# Patient Record
Sex: Female | Born: 1955 | Race: White | Hispanic: No | Smoking: Never smoker
Health system: Southern US, Community
[De-identification: ages and names within clinical notes are randomized; demographics above are authoritative.]

## PROBLEM LIST (undated history)

## (undated) DIAGNOSIS — F32A Depression, unspecified: Secondary | ICD-10-CM

## (undated) DIAGNOSIS — T7840XA Allergy, unspecified, initial encounter: Secondary | ICD-10-CM

## (undated) DIAGNOSIS — F329 Major depressive disorder, single episode, unspecified: Secondary | ICD-10-CM

## (undated) DIAGNOSIS — E079 Disorder of thyroid, unspecified: Secondary | ICD-10-CM

## (undated) HISTORY — DX: Allergy, unspecified, initial encounter: T78.40XA

## (undated) HISTORY — DX: Major depressive disorder, single episode, unspecified: F32.9

## (undated) HISTORY — PX: KNEE SURGERY: SHX244

## (undated) HISTORY — DX: Disorder of thyroid, unspecified: E07.9

## (undated) HISTORY — DX: Depression, unspecified: F32.A

## (undated) HISTORY — PX: TUBAL LIGATION: SHX77

---

## 2005-03-07 ENCOUNTER — Ambulatory Visit: Payer: Self-pay | Admitting: Internal Medicine

## 2006-03-18 ENCOUNTER — Ambulatory Visit: Payer: Self-pay | Admitting: Internal Medicine

## 2007-08-27 ENCOUNTER — Ambulatory Visit: Payer: Self-pay | Admitting: Internal Medicine

## 2009-10-25 ENCOUNTER — Ambulatory Visit: Payer: Self-pay | Admitting: Internal Medicine

## 2009-10-30 ENCOUNTER — Emergency Department: Payer: Self-pay | Admitting: Emergency Medicine

## 2009-12-07 ENCOUNTER — Other Ambulatory Visit: Payer: Self-pay

## 2010-01-19 ENCOUNTER — Other Ambulatory Visit: Payer: Self-pay

## 2010-10-18 ENCOUNTER — Other Ambulatory Visit: Payer: Self-pay

## 2010-11-30 ENCOUNTER — Ambulatory Visit: Payer: Self-pay

## 2010-12-06 ENCOUNTER — Ambulatory Visit: Payer: Self-pay

## 2011-09-20 ENCOUNTER — Ambulatory Visit: Payer: Self-pay | Admitting: Rheumatology

## 2012-04-01 ENCOUNTER — Ambulatory Visit: Payer: Self-pay

## 2013-01-07 ENCOUNTER — Other Ambulatory Visit: Payer: Self-pay

## 2013-01-07 LAB — CBC WITH DIFFERENTIAL/PLATELET
Eosinophil #: 0.5 10*3/uL (ref 0.0–0.7)
Eosinophil %: 8.3 %
HCT: 37.2 % (ref 35.0–47.0)
Lymphocyte #: 1.6 10*3/uL (ref 1.0–3.6)
MCV: 95 fL (ref 80–100)
Monocyte #: 0.3 x10 3/mm (ref 0.2–0.9)
Monocyte %: 5.8 %
Neutrophil %: 57.4 %
Platelet: 350 10*3/uL (ref 150–440)
RBC: 3.92 10*6/uL (ref 3.80–5.20)
RDW: 13.1 % (ref 11.5–14.5)

## 2013-01-07 LAB — LIPID PANEL
HDL Cholesterol: 48 mg/dL (ref 40–60)
Triglycerides: 124 mg/dL (ref 0–200)
VLDL Cholesterol, Calc: 25 mg/dL (ref 5–40)

## 2013-01-07 LAB — COMPREHENSIVE METABOLIC PANEL
BUN: 11 mg/dL (ref 7–18)
Bilirubin,Total: 0.3 mg/dL (ref 0.2–1.0)
Calcium, Total: 9.1 mg/dL (ref 8.5–10.1)
Chloride: 106 mmol/L (ref 98–107)
EGFR (African American): >60
Glucose: 87 mg/dL (ref 65–99)
Osmolality: 276 (ref 275–301)
SGOT(AST): 25 U/L (ref 15–37)
SGPT (ALT): 29 U/L (ref 12–78)
Sodium: 139 mmol/L (ref 136–145)
Total Protein: 8.3 g/dL — ABNORMAL HIGH (ref 6.4–8.2)

## 2013-01-07 LAB — TSH: Thyroid Stimulating Horm: 1.25 u[IU]/mL

## 2013-03-01 ENCOUNTER — Emergency Department: Payer: Self-pay | Admitting: Emergency Medicine

## 2013-04-05 ENCOUNTER — Ambulatory Visit: Payer: Self-pay

## 2013-11-11 ENCOUNTER — Emergency Department: Payer: Self-pay | Admitting: Emergency Medicine

## 2013-11-12 ENCOUNTER — Emergency Department: Payer: Self-pay | Admitting: Emergency Medicine

## 2014-10-13 ENCOUNTER — Other Ambulatory Visit: Payer: Self-pay | Admitting: Physician Assistant

## 2014-10-13 DIAGNOSIS — Z1231 Encounter for screening mammogram for malignant neoplasm of breast: Secondary | ICD-10-CM

## 2014-10-19 ENCOUNTER — Ambulatory Visit
Admission: RE | Admit: 2014-10-19 | Discharge: 2014-10-19 | Disposition: A | Discharge status: Home / Self Care | Payer: 59 | Source: Ambulatory Visit | Attending: Physician Assistant | Admitting: Physician Assistant

## 2014-10-19 DIAGNOSIS — Z801 Family history of malignant neoplasm of trachea, bronchus and lung: Secondary | ICD-10-CM | POA: Diagnosis not present

## 2014-10-19 DIAGNOSIS — Z1231 Encounter for screening mammogram for malignant neoplasm of breast: Secondary | ICD-10-CM | POA: Diagnosis not present

## 2014-11-17 ENCOUNTER — Other Ambulatory Visit
Admission: RE | Admit: 2014-11-17 | Discharge: 2014-11-17 | Disposition: A | Discharge status: Home / Self Care | Payer: 59 | Source: Ambulatory Visit | Attending: Physician Assistant | Admitting: Physician Assistant

## 2014-11-17 DIAGNOSIS — E559 Vitamin D deficiency, unspecified: Secondary | ICD-10-CM | POA: Diagnosis not present

## 2014-11-17 LAB — CBC WITH DIFFERENTIAL/PLATELET
BASOS ABS: 0 10*3/uL (ref 0–0.1)
BASOS PCT: 1 %
Eosinophils Absolute: 0.2 10*3/uL (ref 0–0.7)
Eosinophils Relative: 4 %
HEMATOCRIT: 36 % (ref 35.0–47.0)
Hemoglobin: 12.2 g/dL (ref 12.0–16.0)
LYMPHS PCT: 25 %
Lymphs Abs: 1.5 10*3/uL (ref 1.0–3.6)
MCH: 32.4 pg (ref 26.0–34.0)
MCHC: 34 g/dL (ref 32.0–36.0)
MCV: 95.3 fL (ref 80.0–100.0)
MONO ABS: 0.3 10*3/uL (ref 0.2–0.9)
Monocytes Relative: 5 %
NEUTROS PCT: 65 %
Neutro Abs: 3.8 10*3/uL (ref 1.4–6.5)
Platelets: 303 10*3/uL (ref 150–440)
RBC: 3.78 MIL/uL — ABNORMAL LOW (ref 3.80–5.20)
RDW: 12.7 % (ref 11.5–14.5)
WBC: 5.9 10*3/uL (ref 3.6–11.0)

## 2014-11-17 LAB — COMPREHENSIVE METABOLIC PANEL
ALT: 15 U/L (ref 14–54)
ANION GAP: 7 (ref 5–15)
AST: 20 U/L (ref 15–41)
Albumin: 4 g/dL (ref 3.5–5.0)
Alkaline Phosphatase: 57 U/L (ref 38–126)
BUN: 9 mg/dL (ref 6–20)
CHLORIDE: 107 mmol/L (ref 101–111)
CO2: 26 mmol/L (ref 22–32)
CREATININE: 0.66 mg/dL (ref 0.44–1.00)
Calcium: 9.1 mg/dL (ref 8.9–10.3)
Glucose, Bld: 96 mg/dL (ref 65–99)
Potassium: 4 mmol/L (ref 3.5–5.1)
Sodium: 140 mmol/L (ref 135–145)
TOTAL PROTEIN: 7.6 g/dL (ref 6.5–8.1)
Total Bilirubin: 0.2 mg/dL — ABNORMAL LOW (ref 0.3–1.2)

## 2014-11-17 LAB — LIPID PANEL
CHOLESTEROL: 177 mg/dL (ref 0–200)
HDL: 47 mg/dL (ref >40)
LDL Cholesterol: 111 mg/dL — ABNORMAL HIGH (ref 0–99)
TRIGLYCERIDES: 97 mg/dL (ref <150)
Total CHOL/HDL Ratio: 3.8 RATIO
VLDL: 19 mg/dL (ref 0–40)

## 2014-11-17 LAB — T4, FREE: FREE T4: 0.98 ng/dL (ref 0.61–1.12)

## 2014-11-17 LAB — TSH: TSH: 0.456 u[IU]/mL (ref 0.350–4.500)

## 2014-11-18 LAB — T4: T4, Total: 10.1 ug/dL (ref 4.5–12.0)

## 2014-11-18 LAB — VITAMIN D 25 HYDROXY (VIT D DEFICIENCY, FRACTURES): Vit D, 25-Hydroxy: 25 ng/mL — ABNORMAL LOW (ref 30.0–100.0)

## 2014-11-18 LAB — T3: T3 TOTAL: 115 ng/dL (ref 71–180)

## 2015-04-03 ENCOUNTER — Other Ambulatory Visit: Payer: Self-pay | Admitting: Nurse Practitioner

## 2015-04-03 ENCOUNTER — Ambulatory Visit
Admission: RE | Admit: 2015-04-03 | Discharge: 2015-04-03 | Disposition: A | Discharge status: Home / Self Care | Payer: Managed Care, Other (non HMO) | Source: Ambulatory Visit | Attending: Nurse Practitioner | Admitting: Nurse Practitioner

## 2015-04-03 DIAGNOSIS — M545 Low back pain: Secondary | ICD-10-CM

## 2015-04-03 DIAGNOSIS — M25551 Pain in right hip: Secondary | ICD-10-CM

## 2015-07-04 ENCOUNTER — Other Ambulatory Visit
Admission: RE | Admit: 2015-07-04 | Discharge: 2015-07-04 | Disposition: A | Discharge status: Home / Self Care | Payer: Managed Care, Other (non HMO) | Source: Ambulatory Visit | Attending: Nurse Practitioner | Admitting: Nurse Practitioner

## 2015-07-04 DIAGNOSIS — E039 Hypothyroidism, unspecified: Secondary | ICD-10-CM | POA: Insufficient documentation

## 2015-07-04 LAB — T4, FREE: Free T4: 0.83 ng/dL (ref 0.61–1.12)

## 2015-07-04 LAB — TSH: TSH: 1.848 u[IU]/mL (ref 0.350–4.500)

## 2015-07-09 ENCOUNTER — Emergency Department
Admission: EM | Admit: 2015-07-09 | Discharge: 2015-07-09 | Disposition: A | Discharge status: Home / Self Care | Payer: Managed Care, Other (non HMO) | Attending: Emergency Medicine | Admitting: Emergency Medicine

## 2015-07-09 DIAGNOSIS — J101 Influenza due to other identified influenza virus with other respiratory manifestations: Secondary | ICD-10-CM | POA: Insufficient documentation

## 2015-07-09 DIAGNOSIS — J302 Other seasonal allergic rhinitis: Secondary | ICD-10-CM | POA: Diagnosis not present

## 2015-07-09 DIAGNOSIS — R509 Fever, unspecified: Secondary | ICD-10-CM | POA: Diagnosis present

## 2015-07-09 DIAGNOSIS — R0982 Postnasal drip: Secondary | ICD-10-CM

## 2015-07-09 LAB — RAPID INFLUENZA A&B ANTIGENS (ARMC ONLY): INFLUENZA B (ARMC): NEGATIVE

## 2015-07-09 LAB — RAPID INFLUENZA A&B ANTIGENS: Influenza A (ARMC): POSITIVE — AB

## 2015-07-09 MED ORDER — FLUTICASONE PROPIONATE 50 MCG/ACT NA SUSP: 2.0000 | Freq: Every day | NASAL | Status: DC

## 2015-07-09 MED ORDER — OSELTAMIVIR PHOSPHATE 75 MG PO CAPS: 75.0000 mg | ORAL_CAPSULE | Freq: Two times a day (BID) | ORAL | Status: AC

## 2015-07-09 MED ORDER — BENZONATATE 100 MG PO CAPS: 100.0000 mg | ORAL_CAPSULE | Freq: Three times a day (TID) | ORAL | Status: DC | PRN

## 2015-07-09 NOTE — Discharge Instructions (Signed)
Influenza, Adult Influenza ("the flu") is a viral infection of the respiratory tract. It occurs more often in winter months because people spend more time in close contact with one another. Influenza can make you feel very sick. Influenza easily spreads from person to person (contagious). CAUSES  Influenza is caused by a virus that infects the respiratory tract. You can catch the virus by breathing in droplets from an infected person's cough or sneeze. You can also catch the virus by touching something that was recently contaminated with the virus and then touching your mouth, nose, or eyes. RISKS AND COMPLICATIONS You may be at risk for a more severe case of influenza if you smoke cigarettes, have diabetes, have chronic heart disease (such as heart failure) or lung disease (such as asthma), or if you have a weakened immune system. Elderly people and pregnant women are also at risk for more serious infections. The most common problem of influenza is a lung infection (pneumonia). Sometimes, this problem can require emergency medical care and may be life threatening. SIGNS AND SYMPTOMS  Symptoms typically last 4 to 10 days and may include:  Fever.  Chills.  Headache, body aches, and muscle aches.  Sore throat.  Chest discomfort and cough.  Poor appetite.  Weakness or feeling tired.  Dizziness.  Nausea or vomiting. DIAGNOSIS  Diagnosis of influenza is often made based on your history and a physical exam. A nose or throat swab test can be done to confirm the diagnosis. TREATMENT  In mild cases, influenza goes away on its own. Treatment is directed at relieving symptoms. For more severe cases, your health care provider may prescribe antiviral medicines to shorten the sickness. Antibiotic medicines are not effective because the infection is caused by a virus, not by bacteria. HOME CARE INSTRUCTIONS  Take medicines only as directed by your health care provider.  Use a cool mist humidifier  to make breathing easier.  Get plenty of rest until your temperature returns to normal. This usually takes 3 to 4 days.  Drink enough fluid to keep your urine clear or pale yellow.  Cover yourmouth and nosewhen coughing or sneezing,and wash your handswellto prevent thevirusfrom spreading.  Stay homefromwork orschool untilthe fever is gonefor at least 291full day. PREVENTION  An annual influenza vaccination (flu shot) is the best way to avoid getting influenza. An annual flu shot is now routinely recommended for all adults in the [place] SEEK MEDICAL CARE IF:  You experiencechest pain, yourcough worsens,or you producemore mucus.  Youhave nausea,vomiting, ordiarrhea.  Your fever returns or gets worse. SEEK IMMEDIATE MEDICAL CARE IF:  You havetrouble breathing, you become short of breath,or your skin ornails becomebluish.  You have severe painor stiffnessin the neck.  You develop a sudden headache, or pain in the face or ear.  You have nausea or vomiting that you cannot control. MAKE SURE YOU:   Understand these instructions.  Will watch your condition.  Will get help right away if you are not doing well or get worse.   This information is not intended to replace advice given to you by your health care provider. Make sure you discuss any questions you have with your health care provider.   Document Released: 04/12/2000 Document Revised: 05/06/2014 Document Reviewed: 07/15/2011 Elsevier Interactive Patient Education [employer]2016 [employer].  Take the prescription meds as directed. Rest and hydrate. Follow-up with your provider or return for worsening symptoms.

## 2015-07-09 NOTE — ED Provider Notes (Signed)
[facility] Emergency Department Provider Note ____________________________________________  Time seen: 69  I have reviewed the triage vital signs and the nursing notes.  HISTORY  Chief Complaint  Cough and Fever  HPI Erin Chan is a 60 y.o. female presents to the ED for evaluation of 2 days complaint of cough and sore throat and general malaise. She describes significant sinus drainage and runny nose. She describes yesterday she had episode of cough-induced vomiting where she had significant phlegm in her vomitus. She denies any fevers, chills, or sweats.  No past medical history on file.  There are no active problems to display for this patient.   No past surgical history on file.  Current Outpatient Rx  Name  Route  Sig  Dispense  Refill  . benzonatate (TESSALON PERLES) 100 MG capsule   Oral   Take 1 capsule (100 mg total) by mouth 3 (three) times daily as needed for cough (Take 1-2 per dose).   30 capsule   0   . fluticasone (FLONASE) 50 MCG/ACT nasal spray   Each Nare   Place 2 sprays into both nostrils daily.   16 g   0    Allergies Review of patient's allergies indicates no known allergies.  Family History  Problem Relation Age of Onset  . Lung cancer Mother     Social History Social History  Substance Use Topics  . Smoking status: Not on file  . Smokeless tobacco: Not on file  . Alcohol Use: Not on file   Review of Systems  Constitutional: Negative for fever. Eyes: Negative for visual changes. ENT: Negative for sore throat. Cardiovascular: Negative for chest pain. Respiratory: Negative for shortness of breath. Gastrointestinal: Negative for abdominal pain, vomiting and diarrhea. Genitourinary: Negative for dysuria. Musculoskeletal: Negative for back pain. Skin: Negative for rash. Neurological: Negative for headaches, focal weakness or numbness. ____________________________________________  PHYSICAL  EXAM:  VITAL SIGNS: ED Triage Vitals  Enc Vitals Group     BP 07/09/15 1243 126/70 mmHg     Pulse Rate 07/09/15 1243 86     Resp 07/09/15 1243 18     Temp 07/09/15 1243 98.4 F (36.9 C)     Temp src --      SpO2 07/09/15 1243 95 %     Weight 07/09/15 1243 133 lb (60.328 kg)     Height --      Head Cir --      Peak Flow --      Pain Score 07/09/15 1243 5     Pain Loc --      Pain Edu? --      Excl. in GC? --    Constitutional: Alert and oriented. Well appearing and in no distress. Head: Normocephalic and atraumatic.      Eyes: Conjunctivae are normal. PERRL. Normal extraocular movements      Ears: Canals clear. TMs intact bilaterally.   Nose: No congestion. Clear rhinorrhea.   Mouth/Throat: Mucous membranes are moist.   Neck: Supple. No thyromegaly. Hematological/Lymphatic/Immunological: No cervical lymphadenopathy. Cardiovascular: Normal rate, regular rhythm.  Respiratory: Normal respiratory effort. No wheezes/rales/rhonchi. Gastrointestinal: Soft and nontender. No distention, rebound, guarding, or CVA tenderness. Musculoskeletal: Nontender with normal range of motion in all extremities.  Neurologic:  Normal gait without ataxia. Normal speech and language. No gross focal neurologic deficits are appreciated. Skin:  Skin is warm, dry and intact. No rash noted. Psychiatric: Mood and affect are normal. Patient exhibits appropriate insight and judgment. ____________________________________________  LABS (pertinent positives/negatives) Labs Reviewed  RAPID INFLUENZA A&B ANTIGENS ([facility] ONLY) - Abnormal; Notable for the following:    Influenza A ([facility]) POSITIVE (*)    All other components within normal limits  ____________________________________________  INITIAL IMPRESSION / ASSESSMENT AND PLAN / ED COURSE  Patient with symptoms consistent with influenza upon testing. She will  be discharged with instructions to dose daily allergy medicines for symptom management.  She will also be provided with a prescription for Tamiflu, Flonase, and Tessalon Perles for symptom relief. She'll follow with the primary care provider for further symptom management. Work note is provided for 3 days as requested. ____________________________________________  FINAL CLINICAL IMPRESSION(S) / ED DIAGNOSES  Final diagnoses:  Other seasonal allergic rhinitis  Post-nasal drainage  Influenza A      Lissa HoardJenise V Bacon Laurice Iglesia, PA-C 07/09/15 1441  Jene Everyobert Kinner, MD 07/09/15 [phone]

## 2015-07-09 NOTE — ED Notes (Signed)
Pt reports cough, sore throat, and fevers. Pt states " I just don't feel good". Pt reports symptoms started Friday.

## 2015-07-09 NOTE — ED Notes (Signed)
Pt c/o flu like s/s, denies fever.  NAD

## 2016-03-08 ENCOUNTER — Other Ambulatory Visit: Payer: Self-pay | Admitting: Physician Assistant

## 2016-03-08 DIAGNOSIS — Z1231 Encounter for screening mammogram for malignant neoplasm of breast: Secondary | ICD-10-CM

## 2016-03-12 ENCOUNTER — Other Ambulatory Visit
Admission: RE | Admit: 2016-03-12 | Discharge: 2016-03-12 | Disposition: A | Discharge status: Home / Self Care | Payer: Managed Care, Other (non HMO) | Source: Ambulatory Visit | Attending: Physician Assistant | Admitting: Physician Assistant

## 2016-03-12 DIAGNOSIS — E039 Hypothyroidism, unspecified: Secondary | ICD-10-CM | POA: Diagnosis not present

## 2016-03-12 DIAGNOSIS — E782 Mixed hyperlipidemia: Secondary | ICD-10-CM | POA: Insufficient documentation

## 2016-03-12 DIAGNOSIS — Z0001 Encounter for general adult medical examination with abnormal findings: Secondary | ICD-10-CM | POA: Insufficient documentation

## 2016-03-12 LAB — CBC WITH DIFFERENTIAL/PLATELET
Basophils Absolute: 0 10*3/uL (ref 0–0.1)
Basophils Relative: 1 %
EOS PCT: 10 %
Eosinophils Absolute: 0.5 10*3/uL (ref 0–0.7)
HCT: 37.9 % (ref 35.0–47.0)
Hemoglobin: 13 g/dL (ref 12.0–16.0)
LYMPHS ABS: 1.4 10*3/uL (ref 1.0–3.6)
Lymphocytes Relative: 30 %
MCH: 32.6 pg (ref 26.0–34.0)
MCHC: 34.3 g/dL (ref 32.0–36.0)
MCV: 95.2 fL (ref 80.0–100.0)
MONO ABS: 0.4 10*3/uL (ref 0.2–0.9)
Monocytes Relative: 8 %
Neutro Abs: 2.4 10*3/uL (ref 1.4–6.5)
Neutrophils Relative %: 51 %
Platelets: 287 10*3/uL (ref 150–440)
RBC: 3.98 MIL/uL (ref 3.80–5.20)
RDW: 12.8 % (ref 11.5–14.5)
WBC: 4.7 10*3/uL (ref 3.6–11.0)

## 2016-03-12 LAB — COMPREHENSIVE METABOLIC PANEL
ALBUMIN: 3.9 g/dL (ref 3.5–5.0)
ALT: 21 U/L (ref 14–54)
ANION GAP: 6 (ref 5–15)
AST: 25 U/L (ref 15–41)
Alkaline Phosphatase: 55 U/L (ref 38–126)
BILIRUBIN TOTAL: 0.4 mg/dL (ref 0.3–1.2)
BUN: 13 mg/dL (ref 6–20)
CO2: 27 mmol/L (ref 22–32)
Calcium: 9 mg/dL (ref 8.9–10.3)
Chloride: 106 mmol/L (ref 101–111)
Creatinine, Ser: 0.65 mg/dL (ref 0.44–1.00)
GFR calc Af Amer: >60 mL/min (ref >60)
Glucose, Bld: 93 mg/dL (ref 65–99)
Potassium: 4.2 mmol/L (ref 3.5–5.1)
Sodium: 139 mmol/L (ref 135–145)
TOTAL PROTEIN: 7.8 g/dL (ref 6.5–8.1)

## 2016-03-12 LAB — LIPID PANEL
CHOL/HDL RATIO: 4.4 ratio
Cholesterol: 188 mg/dL (ref 0–200)
HDL: 43 mg/dL (ref >40)
LDL Cholesterol: 126 mg/dL — ABNORMAL HIGH (ref 0–99)
Triglycerides: 94 mg/dL (ref <150)
VLDL: 19 mg/dL (ref 0–40)

## 2016-03-12 LAB — T4, FREE: Free T4: 0.83 ng/dL (ref 0.61–1.12)

## 2016-03-12 LAB — TSH: TSH: 5.945 u[IU]/mL — ABNORMAL HIGH (ref 0.350–4.500)

## 2016-03-13 LAB — T4: T4, Total: 7.6 ug/dL (ref 4.5–12.0)

## 2016-03-13 LAB — HEMOGLOBIN A1C
Hgb A1c MFr Bld: 5.4 % (ref 4.8–5.6)
MEAN PLASMA GLUCOSE: 108 mg/dL

## 2016-04-16 ENCOUNTER — Ambulatory Visit
Admission: RE | Admit: 2016-04-16 | Discharge: 2016-04-16 | Disposition: A | Discharge status: Home / Self Care | Payer: Managed Care, Other (non HMO) | Source: Ambulatory Visit | Attending: Physician Assistant | Admitting: Physician Assistant

## 2016-04-16 DIAGNOSIS — Z1231 Encounter for screening mammogram for malignant neoplasm of breast: Secondary | ICD-10-CM | POA: Insufficient documentation

## 2017-03-12 ENCOUNTER — Other Ambulatory Visit
Admission: RE | Admit: 2017-03-12 | Discharge: 2017-03-12 | Disposition: A | Discharge status: Home / Self Care | Payer: Managed Care, Other (non HMO) | Source: Ambulatory Visit | Attending: Family Medicine | Admitting: Family Medicine

## 2017-03-12 DIAGNOSIS — E039 Hypothyroidism, unspecified: Secondary | ICD-10-CM | POA: Insufficient documentation

## 2017-03-12 DIAGNOSIS — Z0001 Encounter for general adult medical examination with abnormal findings: Secondary | ICD-10-CM | POA: Insufficient documentation

## 2017-03-12 DIAGNOSIS — E782 Mixed hyperlipidemia: Secondary | ICD-10-CM | POA: Diagnosis present

## 2017-03-12 LAB — T4, FREE: Free T4: 0.93 ng/dL (ref 0.61–1.12)

## 2017-03-12 LAB — LIPID PANEL
CHOL/HDL RATIO: 4 ratio
CHOLESTEROL: 188 mg/dL (ref 0–200)
HDL: 47 mg/dL (ref >40)
LDL CALC: 114 mg/dL — AB (ref 0–99)
TRIGLYCERIDES: 136 mg/dL (ref <150)
VLDL: 27 mg/dL (ref 0–40)

## 2017-03-12 LAB — COMPREHENSIVE METABOLIC PANEL
ALT: 17 U/L (ref 14–54)
AST: 21 U/L (ref 15–41)
Albumin: 4 g/dL (ref 3.5–5.0)
Alkaline Phosphatase: 58 U/L (ref 38–126)
Anion gap: 7 (ref 5–15)
BUN: 14 mg/dL (ref 6–20)
CHLORIDE: 106 mmol/L (ref 101–111)
CO2: 25 mmol/L (ref 22–32)
CREATININE: 0.64 mg/dL (ref 0.44–1.00)
Calcium: 9.2 mg/dL (ref 8.9–10.3)
Glucose, Bld: 96 mg/dL (ref 65–99)
POTASSIUM: 4.2 mmol/L (ref 3.5–5.1)
SODIUM: 138 mmol/L (ref 135–145)
Total Bilirubin: 0.4 mg/dL (ref 0.3–1.2)
Total Protein: 8.2 g/dL — ABNORMAL HIGH (ref 6.5–8.1)

## 2017-03-12 LAB — CBC WITH DIFFERENTIAL/PLATELET
BASOS ABS: 0 10*3/uL (ref 0–0.1)
BASOS PCT: 1 %
EOS ABS: 0.2 10*3/uL (ref 0–0.7)
EOS PCT: 3 %
HEMATOCRIT: 38.7 % (ref 35.0–47.0)
Hemoglobin: 12.9 g/dL (ref 12.0–16.0)
Lymphocytes Relative: 28 %
Lymphs Abs: 1.6 10*3/uL (ref 1.0–3.6)
MCH: 31.8 pg (ref 26.0–34.0)
MCHC: 33.2 g/dL (ref 32.0–36.0)
MCV: 95.9 fL (ref 80.0–100.0)
MONOS PCT: 6 %
Monocytes Absolute: 0.4 10*3/uL (ref 0.2–0.9)
Neutro Abs: 3.6 10*3/uL (ref 1.4–6.5)
Neutrophils Relative %: 62 %
PLATELETS: 328 10*3/uL (ref 150–440)
RBC: 4.04 MIL/uL (ref 3.80–5.20)
RDW: 12.9 % (ref 11.5–14.5)
WBC: 5.8 10*3/uL (ref 3.6–11.0)

## 2017-03-12 LAB — TSH: TSH: 3.072 u[IU]/mL (ref 0.350–4.500)

## 2017-03-13 LAB — VITAMIN D 25 HYDROXY (VIT D DEFICIENCY, FRACTURES): VIT D 25 HYDROXY: 21 ng/mL — AB (ref 30.0–100.0)

## 2017-06-30 ENCOUNTER — Other Ambulatory Visit: Payer: Self-pay | Admitting: Nurse Practitioner

## 2017-06-30 DIAGNOSIS — Z1239 Encounter for other screening for malignant neoplasm of breast: Secondary | ICD-10-CM

## 2017-07-25 ENCOUNTER — Other Ambulatory Visit: Payer: Self-pay | Admitting: Nurse Practitioner

## 2017-07-25 MED ORDER — ALBUTEROL SULFATE HFA 108 (90 BASE) MCG/ACT IN AERS: 2.0000 | INHALATION_SPRAY | Freq: Three times a day (TID) | RESPIRATORY_TRACT | 5 refills | Status: DC

## 2017-07-31 ENCOUNTER — Other Ambulatory Visit: Payer: Self-pay | Admitting: Internal Medicine

## 2017-08-18 ENCOUNTER — Other Ambulatory Visit: Payer: Self-pay

## 2017-08-18 ENCOUNTER — Other Ambulatory Visit: Payer: Self-pay | Admitting: Nurse Practitioner

## 2017-08-18 DIAGNOSIS — M810 Age-related osteoporosis without current pathological fracture: Secondary | ICD-10-CM

## 2017-08-18 MED ORDER — ALENDRONATE SODIUM 70 MG PO TABS: 70.0000 mg | ORAL_TABLET | ORAL | 4 refills | Status: DC

## 2017-08-18 NOTE — Progress Notes (Signed)
Med on hold per hospital pharmacy, needing override. Refilled with 11 refills and sent to [facility].

## 2017-08-22 ENCOUNTER — Ambulatory Visit
Admission: RE | Admit: 2017-08-22 | Discharge: 2017-08-22 | Disposition: A | Discharge status: Home / Self Care | Payer: 59 | Source: Ambulatory Visit | Attending: Nurse Practitioner | Admitting: Nurse Practitioner

## 2017-08-22 DIAGNOSIS — Z1239 Encounter for other screening for malignant neoplasm of breast: Secondary | ICD-10-CM

## 2017-08-22 DIAGNOSIS — Z1231 Encounter for screening mammogram for malignant neoplasm of breast: Secondary | ICD-10-CM | POA: Diagnosis present

## 2017-09-08 ENCOUNTER — Ambulatory Visit: Payer: 59 | Admitting: Nurse Practitioner

## 2017-09-08 ENCOUNTER — Encounter: Payer: Self-pay | Admitting: Nurse Practitioner

## 2017-09-08 VITALS — BP 123/75 | HR 68 | Resp 16 | Ht 59.0 in | Wt 144.8 lb

## 2017-09-08 DIAGNOSIS — E039 Hypothyroidism, unspecified: Secondary | ICD-10-CM | POA: Diagnosis not present

## 2017-09-08 DIAGNOSIS — E2839 Other primary ovarian failure: Secondary | ICD-10-CM | POA: Diagnosis not present

## 2017-09-08 DIAGNOSIS — J4520 Mild intermittent asthma, uncomplicated: Secondary | ICD-10-CM | POA: Insufficient documentation

## 2017-09-08 DIAGNOSIS — M810 Age-related osteoporosis without current pathological fracture: Secondary | ICD-10-CM | POA: Insufficient documentation

## 2017-09-08 NOTE — Progress Notes (Signed)
[facility] [address] [place], [place] [address]  Internal MEDICINE  Office Visit Note  Patient Name: Erin Chan  [id number]  [id number]  Date of Service: 09/08/2017  Chief Complaint  Patient presents with  . Osteoporosis    Patient is due to have bone density. Last bone density was done 2016, showing osteoporosis in spine and osteopenia in femur and pelvis.  Continues to have episodes of depression. Son passed away four years ago. Does have more good days than bad. Does lean on her family members and friends when she needs to. She does have a lving daughter who she leans on as well.    Pt is here for routine follow up.    Current Medication: Outpatient Encounter Medications as of 09/08/2017  Medication Sig  . albuterol (PROVENTIL HFA;VENTOLIN HFA) 108 (90 Base) MCG/ACT inhaler Inhale 2 puffs into the lungs 3 (three) times daily.  Marland Kitchen alendronate (FOSAMAX) 70 MG tablet Take 1 tablet (70 mg total) by mouth once a week. Take with a full glass of water on an empty stomach.  . clobetasol cream (TEMOVATE) 0.05 % Apply 1 application topically 2 (two) times daily.  . fluticasone (FLONASE) 50 MCG/ACT nasal spray Place 2 sprays into both nostrils daily.  Marland Kitchen levothyroxine (SYNTHROID, LEVOTHROID) 75 MCG tablet TAKE 1 TABLET BY MOUTH ONCE A DAY BEFORE BREAKFAST  . [DISCONTINUED] benzonatate (TESSALON PERLES) 100 MG capsule Take 1 capsule (100 mg total) by mouth 3 (three) times daily as needed for cough (Take 1-2 per dose). (Patient not taking: Reported on 09/08/2017)   No facility-administered encounter medications on file as of 09/08/2017.     Surgical History: Past Surgical History:  Procedure Laterality Date  . KNEE SURGERY Right   . TUBAL LIGATION      Medical History: Past Medical History:  Diagnosis Date  . Allergy   . Depression   . Thyroid disease     Family History: Family History  Problem Relation Age of Onset  . Lung cancer Mother   . Diabetes  Maternal Grandmother   . Heart attack Maternal Grandfather   . Breast cancer Neg Hx     Social History   Socioeconomic History  . Marital status: Divorced    Spouse name: Not on file  . Number of children: Not on file  . Years of education: Not on file  . Highest education level: Not on file  Occupational History  . Not on file  Social Needs  . Financial resource strain: Not on file  . Food insecurity:    Worry: Not on file    Inability: Not on file  . Transportation needs:    Medical: Not on file    Non-medical: Not on file  Tobacco Use  . Smoking status: Never Smoker  . Smokeless tobacco: Never Used  Substance and Sexual Activity  . Alcohol use: Never    Frequency: Never  . Drug use: Never  . Sexual activity: Not on file  Lifestyle  . Physical activity:    Days per week: Not on file    Minutes per session: Not on file  . Stress: Not on file  Relationships  . Social connections:    Talks on phone: Not on file    Gets together: Not on file    Attends religious service: Not on file    Active member of club or organization: Not on file    Attends meetings of clubs or organizations: Not on file  Relationship status: Not on file  . Intimate partner violence:    Fear of current or ex partner: Not on file    Emotionally abused: Not on file    Physically abused: Not on file    Forced sexual activity: Not on file  Other Topics Concern  . Not on file  Social History Narrative  . Not on file      Review of Systems  Constitutional: Negative for chills, fatigue and unexpected weight change.  HENT: Positive for sneezing. Negative for congestion, postnasal drip, rhinorrhea and sore throat.   Eyes: Negative.  Negative for redness.  Respiratory: Negative for cough, chest tightness and shortness of breath.   Cardiovascular: Negative for chest pain and palpitations.  Gastrointestinal: Negative for abdominal pain, constipation, diarrhea, nausea and vomiting.  Endocrine:  Negative for cold intolerance, heat intolerance, polydipsia, polyphagia and polyuria.  Genitourinary: Negative.  Negative for dysuria and frequency.  Musculoskeletal: Negative for arthralgias, back pain, joint swelling and neck pain.  Skin: Negative for rash.  Allergic/Immunologic: Positive for environmental allergies.  Neurological: Positive for dizziness and headaches. Negative for tremors and numbness.       Intermittent symptoms, mostly when sinuses are acting up.   Hematological: Negative for adenopathy. Does not bruise/bleed easily.  Psychiatric/Behavioral: Positive for dysphoric mood. Negative for behavioral problems (Depression), sleep disturbance and suicidal ideas. The patient is not nervous/anxious.    Today's Vitals   09/08/17 0925  BP: 123/75  Pulse: 68  Resp: 16  SpO2: 98%  Weight: 144 lb 12.8 oz (65.7 kg)  Height:  (1.499 m)    Physical Exam  Constitutional: She is oriented to person, place, and time. She appears well-developed and well-nourished. No distress.  HENT:  Head: Normocephalic and atraumatic.  Mouth/Throat: Oropharynx is clear and moist. No oropharyngeal exudate.  Eyes: Pupils are equal, round, and reactive to light. Conjunctivae and EOM are normal.  Neck: Normal range of motion. Neck supple. No JVD present. Carotid bruit is not present. No tracheal deviation present. No thyromegaly present.  Cardiovascular: Normal rate, regular rhythm and normal heart sounds. Exam reveals no gallop and no friction rub.  No murmur heard. Pulmonary/Chest: Effort normal and breath sounds normal. No respiratory distress. She has no wheezes. She has no rales. She exhibits no tenderness.  Abdominal: Soft. Bowel sounds are normal. There is no tenderness.  Musculoskeletal: Normal range of motion.  Lymphadenopathy:    She has no cervical adenopathy.  Neurological: She is alert and oriented to person, place, and time. She displays normal reflexes. No cranial nerve deficit.   Skin: Skin is warm and dry. Capillary refill takes less than 2 seconds. She is not diaphoretic.  Psychiatric: She has a normal mood and affect. Her behavior is normal. Judgment and thought content normal.  Nursing note and vitals reviewed.   Assessment/Plan: 1. Age-related osteoporosis without current pathological fracture New bone density ordered. Will advise patient of results and adjust treatment as indicated.  - DG Bone Density; Future  2. Ovarian failure Continue fosamax as prescribed. Adjust dosing/treatment based on new test results.  - DG Bone Density; Future  3. Acquired hypothyroidism Stable. Continue levothyroxine as prescribed.   4. Mild intermittent asthma without complication Continue rescue inhaler as needed and as prescribed.   General Counseling: Lucielle verbalizes understanding of the findings of todays visit and agrees with plan of treatment. I have discussed any further diagnostic evaluation that may be needed or ordered today. We also reviewed her medications today. she  has been encouraged to call the office with any questions or concerns that should arise related to todays visit.  This patient was seen by Vincent Gros, FNP- C in Collaboration with Dr Lyndon Code as a part of collaborative care agreement    Orders Placed This Encounter  Procedures  . DG Bone Density    No orders of the defined types were placed in this encounter.   Time spent: 42 Minutes      Dr Lyndon Code Internal medicine

## 2017-10-02 ENCOUNTER — Ambulatory Visit: Payer: Self-pay | Admitting: Nurse Practitioner

## 2017-10-09 ENCOUNTER — Other Ambulatory Visit: Payer: Self-pay

## 2017-10-09 ENCOUNTER — Emergency Department
Admission: EM | Admit: 2017-10-09 | Discharge: 2017-10-10 | Disposition: A | Discharge status: Home / Self Care | Payer: No Typology Code available for payment source | Attending: Emergency Medicine | Admitting: Emergency Medicine

## 2017-10-09 ENCOUNTER — Encounter: Payer: Self-pay | Admitting: Emergency Medicine

## 2017-10-09 ENCOUNTER — Emergency Department: Payer: No Typology Code available for payment source

## 2017-10-09 DIAGNOSIS — E039 Hypothyroidism, unspecified: Secondary | ICD-10-CM | POA: Insufficient documentation

## 2017-10-09 DIAGNOSIS — R05 Cough: Secondary | ICD-10-CM | POA: Diagnosis not present

## 2017-10-09 DIAGNOSIS — Z79899 Other long term (current) drug therapy: Secondary | ICD-10-CM | POA: Insufficient documentation

## 2017-10-09 DIAGNOSIS — R110 Nausea: Secondary | ICD-10-CM

## 2017-10-09 DIAGNOSIS — R519 Headache, unspecified: Secondary | ICD-10-CM

## 2017-10-09 DIAGNOSIS — J4520 Mild intermittent asthma, uncomplicated: Secondary | ICD-10-CM | POA: Insufficient documentation

## 2017-10-09 DIAGNOSIS — R51 Headache: Secondary | ICD-10-CM | POA: Insufficient documentation

## 2017-10-09 DIAGNOSIS — R509 Fever, unspecified: Secondary | ICD-10-CM | POA: Insufficient documentation

## 2017-10-09 DIAGNOSIS — R059 Cough, unspecified: Secondary | ICD-10-CM

## 2017-10-09 LAB — CBC WITH DIFFERENTIAL/PLATELET
BASOS ABS: 0 10*3/uL (ref 0–0.1)
Basophils Relative: 1 %
Eosinophils Absolute: 0 10*3/uL (ref 0–0.7)
Eosinophils Relative: 0 %
HEMATOCRIT: 37.5 % (ref 35.0–47.0)
HEMOGLOBIN: 13.1 g/dL (ref 12.0–16.0)
LYMPHS PCT: 12 %
Lymphs Abs: 0.4 10*3/uL — ABNORMAL LOW (ref 1.0–3.6)
MCH: 32.9 pg (ref 26.0–34.0)
MCHC: 35 g/dL (ref 32.0–36.0)
MCV: 93.8 fL (ref 80.0–100.0)
Monocytes Absolute: 0.1 10*3/uL — ABNORMAL LOW (ref 0.2–0.9)
Monocytes Relative: 4 %
NEUTROS ABS: 2.4 10*3/uL (ref 1.4–6.5)
NEUTROS PCT: 83 %
Platelets: 137 10*3/uL — ABNORMAL LOW (ref 150–440)
RBC: 3.99 MIL/uL (ref 3.80–5.20)
RDW: 12.7 % (ref 11.5–14.5)
WBC: 2.9 10*3/uL — AB (ref 3.6–11.0)

## 2017-10-09 LAB — URINALYSIS, COMPLETE (UACMP) WITH MICROSCOPIC
BACTERIA UA: NONE SEEN
BILIRUBIN URINE: NEGATIVE
Glucose, UA: NEGATIVE mg/dL
KETONES UR: 20 mg/dL — AB
LEUKOCYTES UA: NEGATIVE
Nitrite: NEGATIVE
Protein, ur: 100 mg/dL — AB
Specific Gravity, Urine: 1.026 (ref 1.005–1.030)
pH: 5 (ref 5.0–8.0)

## 2017-10-09 LAB — COMPREHENSIVE METABOLIC PANEL
ALT: 52 U/L (ref 14–54)
AST: 107 U/L — AB (ref 15–41)
Albumin: 4 g/dL (ref 3.5–5.0)
Alkaline Phosphatase: 87 U/L (ref 38–126)
Anion gap: 11 (ref 5–15)
BUN: 15 mg/dL (ref 6–20)
CHLORIDE: 100 mmol/L — AB (ref 101–111)
CO2: 22 mmol/L (ref 22–32)
Calcium: 8.5 mg/dL — ABNORMAL LOW (ref 8.9–10.3)
Creatinine, Ser: 0.78 mg/dL (ref 0.44–1.00)
GFR calc Af Amer: >60 mL/min (ref >60)
Glucose, Bld: 115 mg/dL — ABNORMAL HIGH (ref 65–99)
POTASSIUM: 3.6 mmol/L (ref 3.5–5.1)
Sodium: 133 mmol/L — ABNORMAL LOW (ref 135–145)
Total Bilirubin: 0.6 mg/dL (ref 0.3–1.2)
Total Protein: 8.2 g/dL — ABNORMAL HIGH (ref 6.5–8.1)

## 2017-10-09 LAB — LACTIC ACID, PLASMA: LACTIC ACID, VENOUS: 0.9 mmol/L (ref 0.5–1.9)

## 2017-10-09 MED ORDER — SODIUM CHLORIDE 0.9 % IV BOLUS
1000.0000 mL | Freq: Once | INTRAVENOUS | Status: AC
  Administered 2017-10-09: 1000 mL via INTRAVENOUS

## 2017-10-09 MED ORDER — DOXYCYCLINE HYCLATE 100 MG PO TABS
100.0000 mg | ORAL_TABLET | Freq: Once | ORAL | Status: AC
  Administered 2017-10-09: 100 mg via ORAL
  Filled 2017-10-09: qty 1

## 2017-10-09 MED ORDER — DOXYCYCLINE HYCLATE 100 MG PO CAPS: 100.0000 mg | ORAL_CAPSULE | Freq: Two times a day (BID) | ORAL | 0 refills | Status: DC

## 2017-10-09 MED ORDER — PROCHLORPERAZINE EDISYLATE 10 MG/2ML IJ SOLN
10.0000 mg | Freq: Once | INTRAMUSCULAR | Status: AC
  Administered 2017-10-09: 10 mg via INTRAVENOUS
  Filled 2017-10-09: qty 2

## 2017-10-09 MED ORDER — IBUPROFEN 600 MG PO TABS
600.0000 mg | ORAL_TABLET | Freq: Once | ORAL | Status: AC
  Administered 2017-10-09: 600 mg via ORAL
  Filled 2017-10-09: qty 1

## 2017-10-09 MED ORDER — DIPHENHYDRAMINE HCL 50 MG/ML IJ SOLN
25.0000 mg | Freq: Once | INTRAMUSCULAR | Status: AC
  Administered 2017-10-09: 25 mg via INTRAVENOUS
  Filled 2017-10-09: qty 1

## 2017-10-09 NOTE — Progress Notes (Addendum)
CODE SEPSIS - PHARMACY COMMUNICATION  **Broad Spectrum Antibiotics should be administered within 1 hour of Sepsis diagnosis**  Time Code Sepsis Called/Page Received: 06/13 2054  Antibiotics Ordered: n/a  Time of 1st antibiotic administration: n/a  Additional action taken by pharmacy: Called ED RN to inquired if code sepsis still active. She will f/u with EDP  If necessary, Name of Provider/Nurse Contacted: Eileen StanfordJenna   Add: 6/13 2330 no abx per ED RN, Pt to discharge home.    Erich MontaneMcBane,Billie Intriago S ,PharmD Clinical Pharmacist  10/09/2017  10:11 PM

## 2017-10-09 NOTE — ED Triage Notes (Addendum)
Patient ambulatory to triage with unsteady gait, without difficulty or distress noted; pt reports since Tuesday having generalized HA accomp by nausea; st hx migraines although she has never had testing for such; pt also c/o feeling dizzy/weak; pt st she took an medicine for her HA PTA and st that it is "arythromycin"; pt taken to room 6 via w/c by Bennetta LaosStephen RN; report called to care nurse Eileen StanfordJenna RN

## 2017-10-09 NOTE — ED Provider Notes (Signed)
[facility] Emergency Department Provider Note ____________________________________________   First MD Initiated Contact with Patient 10/09/17 1948     (approximate)  I have reviewed the triage vital signs and the nursing notes.   HISTORY  Chief Complaint Migraine and Dizziness  HPI Erin Chan is a 62 y.o. female with a history of depression and thyroid disease who is presenting to the emergency department today with a frontal, pressure-like headache that is been ongoing since this past Tuesday.  She says it is a 5 out of 10 and has been decreasing in intensity since this past Tuesday.  She denies any runny nose but says that she has had a nonproductive cough.  Has had nausea.  Denies diarrhea.  No known sick contacts.  No body aches.  No burning or frequency with her urination.  No neck pain.  Denies any cold sores.  Past Medical History:  Diagnosis Date  . Allergy   . Depression   . Thyroid disease     Patient Active Problem List   Diagnosis Date Noted  . Age-related osteoporosis without current pathological fracture 09/08/2017  . Ovarian failure 09/08/2017  . Acquired hypothyroidism 09/08/2017  . Mild intermittent asthma without complication 09/08/2017    Past Surgical History:  Procedure Laterality Date  . KNEE SURGERY Right   . TUBAL LIGATION      Prior to Admission medications   Medication Sig Start Date End Date Taking? Authorizing Provider  albuterol (PROVENTIL HFA;VENTOLIN HFA) 108 (90 Base) MCG/ACT inhaler Inhale 2 puffs into the lungs 3 (three) times daily. 07/25/17   Carlean JewsBoscia, Heather E, NP  alendronate (FOSAMAX) 70 MG tablet Take 1 tablet (70 mg total) by mouth once a week. Take with a full glass of water on an empty stomach. 08/18/17   Carlean JewsBoscia, Heather E, NP  clobetasol cream (TEMOVATE) 0.05 % Apply 1 application topically 2 (two) times daily.    [provider]  fluticasone (FLONASE) 50 MCG/ACT nasal spray Place 2 sprays  into both nostrils daily. 07/09/15   Menshew, Charlesetta IvoryJenise V Bacon, PA-C  levothyroxine (SYNTHROID, LEVOTHROID) 75 MCG tablet TAKE 1 TABLET BY MOUTH ONCE A DAY BEFORE BREAKFAST 07/31/17   Lyndon CodeKhan, Fozia M, MD    Allergies Patient has no known allergies.  Family History  Problem Relation Age of Onset  . Lung cancer Mother   . Diabetes Maternal Grandmother   . Heart attack Maternal Grandfather   . Breast cancer Neg Hx     Social History Social History   Tobacco Use  . Smoking status: Never Smoker  . Smokeless tobacco: Never Used  Substance Use Topics  . Alcohol use: Never    Frequency: Never  . Drug use: Never    Review of Systems  Constitutional: Fever as of today Eyes: No visual changes. ENT: No sore throat. Cardiovascular: Denies chest pain. Respiratory: As above Gastrointestinal: No abdominal pain. no vomiting.  No diarrhea.  No constipation. Genitourinary: Negative for dysuria. Musculoskeletal: Negative for back pain. Skin: Negative for rash. Neurological: Negative for focal weakness or numbness.   ____________________________________________   PHYSICAL EXAM:  VITAL SIGNS: ED Triage Vitals [10/09/17 1944]  Enc Vitals Group     BP (!) 132/119     Pulse Rate (!) 101     Resp 18     Temp (!) 101 F (38.3 C)     Temp Source Oral     SpO2 97 %     Weight 140 lb (63.5 kg)  Height 4\' 11"  (1.499 m)     Head Circumference      Peak Flow      Pain Score 6     Pain Loc      Pain Edu?      Excl. in GC?     Constitutional: Alert and oriented. Well appearing and in no acute distress. Eyes: Conjunctivae are normal.  Head: Atraumatic. Nose: No congestion/rhinnorhea. Mouth/Throat: Mucous membranes are moist.  No tenderness to palpation over the sinuses Neck: No stridor.  Range his head neck freely without any meningismus. Cardiovascular: Normal rate, regular rhythm. Grossly normal heart sounds.   Respiratory: Normal respiratory effort.  No retractions. Lungs  CTAB. Gastrointestinal: Soft and nontender. No distention.  Musculoskeletal: No lower extremity tenderness nor edema.  No joint effusions. Neurologic:  Normal speech and language. No gross focal neurologic deficits are appreciated. Skin:  Skin is warm, dry and intact. No rash noted. Psychiatric: Mood and affect are normal. Speech and behavior are normal.  ____________________________________________   LABS (all labs ordered are listed, but only abnormal results are displayed)  Labs Reviewed  COMPREHENSIVE METABOLIC PANEL - Abnormal; Notable for the following components:      Result Value   Sodium 133 (*)    Chloride 100 (*)    Glucose, Bld 115 (*)    Calcium 8.5 (*)    Total Protein 8.2 (*)    AST 107 (*)    All other components within normal limits  CBC WITH DIFFERENTIAL/PLATELET - Abnormal; Notable for the following components:   WBC 2.9 (*)    Platelets 137 (*)    Lymphs Abs 0.4 (*)    Monocytes Absolute 0.1 (*)    All other components within normal limits  CULTURE, BLOOD (ROUTINE X 2)  CULTURE, BLOOD (ROUTINE X 2)  URINE CULTURE  LACTIC ACID, PLASMA  LACTIC ACID, PLASMA  URINALYSIS, COMPLETE (UACMP) WITH MICROSCOPIC   ____________________________________________  EKG  ED ECG REPORT I, Arelia Longest, the attending physician, personally viewed and interpreted this ECG.   Date: 10/09/2017  EKG Time: 2114  Rate: 85  Rhythm: normal sinus rhythm  Axis: Normal  Intervals:none  ST&T Change: No ST segment elevation or depression.  No abnormal T wave inversion.  ____________________________________________  RADIOLOGY  No active cardiopulmonary disease on the chest x-ray.  No acute intracranial abnormalities on the head CT ____________________________________________   PROCEDURES  Procedure(s) performed:   Procedures  Critical Care performed:   ____________________________________________   INITIAL IMPRESSION / ASSESSMENT AND PLAN / ED  COURSE  Pertinent labs & imaging results that were available during my care of the patient were reviewed by me and considered in my medical decision making (see chart for details).  Differential diagnosis includes, but is not limited to, intracranial hemorrhage, meningitis/encephalitis, previous head trauma, cavernous venous thrombosis, tension headache, temporal arteritis, migraine or migraine equivalent, idiopathic intracranial hypertension, and non-specific headache. As part of my medical decision making, I reviewed the following data within the electronic MEDICAL RECORD NUMBER Notes from prior ED visits  ----------------------------------------- 11:22 PM on 10/09/2017 -----------------------------------------  Patient at this time afebrile with a temperature of 99.5.  Heart rate in the 80s.  Denies any headache.  Denies any known tick or insect bite.  However, given elevation of fever tonight I will cover her with doxycycline.  She is aware to return for any worsening or concerning symptoms.  Never had any neck pain.  Headache was frontal.  No cold sores or other oral  mucosal lesions.  Unlikely to be meningitis or encephalitis.  Patient will continue antipyretics at home as needed.  She is understanding of the diagnosis and treatment plan and willing to comply.  Also high probability of this being a viral illness. ____________________________________________   FINAL CLINICAL IMPRESSION(S) / ED DIAGNOSES  Fever.  Headache.  Nausea.  Cough.  NEW MEDICATIONS STARTED DURING THIS VISIT:  New Prescriptions   No medications on file     Note:  This document was prepared using Dragon voice recognition software and may include unintentional dictation errors.     Myrna Blazer, MD 10/09/17 [phone]

## 2017-10-09 NOTE — ED Notes (Signed)
Patient transported to CT 

## 2017-10-09 NOTE — ED Notes (Signed)
Patient c/o headache and dizziness beginning Tuesday. Patient ambulates with unstable gait for this RN. Patient reports she took acetaminophen this am for fever.

## 2017-10-10 NOTE — ED Notes (Signed)
Reviewed discharge instructions, follow-up care, and prescriptions with patient. Patient verbalized understanding of all information reviewed. Patient stable, with no distress noted at this time.    

## 2017-10-11 LAB — URINE CULTURE

## 2017-10-14 LAB — ROCKY MTN SPOTTED FVR ABS PNL(IGG+IGM)
RMSF IgG: POSITIVE — AB
RMSF IgM: 0.65 index (ref 0.00–0.89)

## 2017-10-14 LAB — CULTURE, BLOOD (ROUTINE X 2)
CULTURE: NO GROWTH
Culture: NO GROWTH

## 2017-10-14 LAB — RMSF, IGG, IFA: RMSF, IGG, IFA: 1:64 {titer} — ABNORMAL HIGH

## 2017-10-15 ENCOUNTER — Telehealth: Payer: Self-pay | Admitting: Emergency Medicine

## 2017-10-15 NOTE — Progress Notes (Signed)
Thank you. I saw she was given some in Er, but didn't see prescription.

## 2017-10-15 NOTE — Telephone Encounter (Signed)
Called patient to give rmsf results.  She is taking the doxycycline and is feeling better.  Says she has appt with pcp on Tuesday next week.  I sent the result to her pcpvia epic.

## 2017-10-21 ENCOUNTER — Encounter: Payer: Self-pay | Admitting: Nurse Practitioner

## 2017-10-21 ENCOUNTER — Ambulatory Visit: Payer: 59 | Admitting: Nurse Practitioner

## 2017-10-21 VITALS — BP 130/80 | HR 65 | Resp 16 | Ht 59.5 in | Wt 140.0 lb

## 2017-10-21 DIAGNOSIS — A770 Spotted fever due to Rickettsia rickettsii: Secondary | ICD-10-CM | POA: Insufficient documentation

## 2017-10-21 DIAGNOSIS — G4459 Other complicated headache syndrome: Secondary | ICD-10-CM | POA: Diagnosis not present

## 2017-10-21 DIAGNOSIS — E039 Hypothyroidism, unspecified: Secondary | ICD-10-CM | POA: Diagnosis not present

## 2017-10-21 MED ORDER — BUTALBITAL-APAP-CAFFEINE 50-300-40 MG PO CAPS: 1.0000 | ORAL_CAPSULE | Freq: Two times a day (BID) | ORAL | 2 refills | Status: DC | PRN

## 2017-10-21 MED ORDER — BUTALBITAL-APAP-CAFFEINE 50-325-40 MG PO TABS: 1.0000 | ORAL_TABLET | Freq: Four times a day (QID) | ORAL | 2 refills | Status: AC | PRN

## 2017-10-21 NOTE — Progress Notes (Signed)
[facility] [address] [place], [place] [address]  Internal MEDICINE  Office Visit Note  Patient Name: Erin Chan  [id number]  [id number]  Date of Service: 10/21/2017  Chief Complaint  Patient presents with  . Hospitalization Follow-up     The patient made visit to the ER due to high fever and confusion. Was diagnosed with [place] Spotted Fever. Treated with doxycycline and has finished the antibiotics. She finished these this past Sunday evening. She feels very tired and continues to have headaches. These were present since prior to this infection. She missed work on June 11, 12, and 13th of June. She also missed June 23rd due to persistent symptoms. She would like to have FMLA paperwork filled out to protect her job.   Pt is here for a sick visit.     Current Medication:  Outpatient Encounter Medications as of 10/21/2017  Medication Sig  . albuterol (PROVENTIL HFA;VENTOLIN HFA) 108 (90 Base) MCG/ACT inhaler Inhale 2 puffs into the lungs 3 (three) times daily.  Marland Kitchen alendronate (FOSAMAX) 70 MG tablet Take 1 tablet (70 mg total) by mouth once a week. Take with a full glass of water on an empty stomach. (Patient taking differently: Take 70 mg by mouth every Wednesday. Take with a full glass of water on an empty stomach. )  . clobetasol cream (TEMOVATE) 0.05 % Apply 1 application topically 2 (two) times daily.  . fluticasone (FLONASE) 50 MCG/ACT nasal spray Place 2 sprays into both nostrils daily.  Marland Kitchen levothyroxine (SYNTHROID, LEVOTHROID) 75 MCG tablet TAKE 1 TABLET BY MOUTH ONCE A DAY BEFORE BREAKFAST  . butalbital-acetaminophen-caffeine (FIORICET, ESGIC) 50-325-40 MG tablet Take 1-2 tablets by mouth every 6 (six) hours as needed for headache.  . [DISCONTINUED] Butalbital-APAP-Caffeine 50-300-40 MG CAPS Take 1 capsule by mouth 2 (two) times daily as needed.  . [DISCONTINUED] Butalbital-APAP-Caffeine 50-300-40 MG CAPS Take 1 capsule by mouth 2 (two) times  daily as needed.  . [DISCONTINUED] doxycycline (VIBRAMYCIN) 100 MG capsule Take 1 capsule (100 mg total) by mouth 2 (two) times daily. (Patient not taking: Reported on 10/21/2017)   No facility-administered encounter medications on file as of 10/21/2017.       Medical History: Past Medical History:  Diagnosis Date  . Allergy   . Depression   . Thyroid disease      Vital Signs: BP 130/80   Pulse 65   Resp 16   Ht 4' 11.5" (1.511 m)   Wt 140 lb (63.5 kg)   SpO2 95%   BMI 27.80 kg/m    Review of Systems  Constitutional: Positive for fatigue. Negative for chills and unexpected weight change.  HENT: Negative for congestion, postnasal drip, rhinorrhea, sneezing and sore throat.   Eyes: Negative.  Negative for redness.  Respiratory: Negative for cough, chest tightness and shortness of breath.   Cardiovascular: Negative for chest pain and palpitations.  Gastrointestinal: Negative for abdominal pain, constipation, diarrhea, nausea and vomiting.  Endocrine: Negative for cold intolerance, heat intolerance, polydipsia, polyphagia and polyuria.  Genitourinary: Negative.  Negative for dysuria and frequency.  Musculoskeletal: Negative for arthralgias, back pain, joint swelling and neck pain.  Skin: Negative for rash.  Allergic/Immunologic: Positive for environmental allergies.  Neurological: Positive for dizziness and headaches. Negative for tremors and numbness.       Intermittent symptoms, mostly when sinuses are acting up.   Hematological: Negative for adenopathy. Does not bruise/bleed easily.  Psychiatric/Behavioral: Positive for dysphoric mood. Negative for behavioral problems (Depression), sleep disturbance  and suicidal ideas. The patient is not nervous/anxious.     Physical Exam  Constitutional: She is oriented to person, place, and time. She appears well-developed and well-nourished. No distress.  HENT:  Head: Normocephalic and atraumatic.  Mouth/Throat: Oropharynx is clear  and moist. No oropharyngeal exudate.  Eyes: Pupils are equal, round, and reactive to light. Conjunctivae and EOM are normal.  Neck: Normal range of motion. Neck supple. No JVD present. Carotid bruit is not present. No tracheal deviation present. No thyromegaly present.  Cardiovascular: Normal rate, regular rhythm and normal heart sounds. Exam reveals no gallop and no friction rub.  No murmur heard. Pulmonary/Chest: Effort normal and breath sounds normal. No respiratory distress. She has no wheezes. She has no rales. She exhibits no tenderness.  Abdominal: Soft. Bowel sounds are normal. There is no tenderness.  Musculoskeletal: Normal range of motion.  Lymphadenopathy:    She has no cervical adenopathy.  Neurological: She is alert and oriented to person, place, and time. She displays normal reflexes. No cranial nerve deficit.  Skin: Skin is warm and dry. Capillary refill takes less than 2 seconds. She is not diaphoretic.  Psychiatric: She has a normal mood and affect. Her behavior is normal. Judgment and thought content normal.  Nursing note and vitals reviewed.  Assessment/Plan: 1. Other complicated headache syndrome Add butalbital/APAP/Caff. May take up to twice daily if needed for headache. Advised she take with caution as may cause dizzines or drowsiness. She voiced understanding.  - butalbital-acetaminophen-caffeine (FIORICET, ESGIC) 50-325-40 MG tablet; Take 1-2 tablets by mouth every 6 (six) hours as needed for headache.  Dispense: 45 tablet; Refill: 2  2. [place] spotted fever Diagnosis 10/10/2017 in ER. Finished antibiotic therapy. Still having extreme fatigue, headache, and muscle pain. Will fill out FMLA paperwork to protect her job.   3. Acquired hypothyroidism Continue levothyroxine as prescribed.   General Counseling: [name] verbalizes understanding of the findings of todays visit and agrees with plan of treatment. I have discussed any further diagnostic evaluation that  may be needed or ordered today. We also reviewed her medications today. she has been encouraged to call the office with any questions or concerns that should arise related to todays visit.    Counseling:  Reviewed risks and possible side effects associated with taking opiates and benzodiazepines. Combination of these could cause dizziness and drowsiness. Advised him not to drive or operate machinery when taking these medications, as he could put his life and the lives of others at risk. He voiced understanding.   This patient was seen by Vincent Gros, FNP- C in Collaboration with Dr Lyndon Code as a part of collaborative care agreement  Meds ordered this encounter  Medications  . DISCONTD: Butalbital-APAP-Caffeine 50-300-40 MG CAPS    Sig: Take 1 capsule by mouth 2 (two) times daily as needed.    Dispense:  45 capsule    Refill:  2    Order Specific Question:   Supervising Provider    Answer:   Lyndon Code [[id number]]  . DISCONTD: Butalbital-APAP-Caffeine 50-300-40 MG CAPS    Sig: Take 1 capsule by mouth 2 (two) times daily as needed.    Dispense:  45 capsule    Refill:  2    Order Specific Question:   Supervising Provider    Answer:   Lyndon Code [[id number]]  . butalbital-acetaminophen-caffeine (FIORICET, ESGIC) 50-325-40 MG tablet    Sig: Take 1-2 tablets by mouth every 6 (six) hours as needed for headache.  Dispense:  45 tablet    Refill:  2    Order Specific Question:   Supervising Provider    Answer:   Lyndon CodeKHAN, FOZIA M [[id number]]    Time spent: 15 Minutes

## 2017-10-27 ENCOUNTER — Telehealth: Payer: Self-pay | Admitting: Nurse Practitioner

## 2017-10-28 ENCOUNTER — Other Ambulatory Visit: Payer: Self-pay | Admitting: Internal Medicine

## 2017-11-03 ENCOUNTER — Telehealth: Payer: Self-pay

## 2017-11-03 NOTE — Telephone Encounter (Signed)
Left message advising patient that her FMLA paperwork is ready for pickup/Titania

## 2017-11-11 NOTE — Telephone Encounter (Signed)
Patient has picked up paperwork.Erin Chan

## 2017-11-18 ENCOUNTER — Other Ambulatory Visit: Payer: Self-pay | Admitting: Internal Medicine

## 2017-12-09 ENCOUNTER — Ambulatory Visit: Payer: 59 | Admitting: Nurse Practitioner

## 2017-12-09 ENCOUNTER — Encounter: Payer: Self-pay | Admitting: Nurse Practitioner

## 2017-12-09 VITALS — BP 108/64 | HR 76 | Resp 16 | Ht 59.0 in | Wt 142.4 lb

## 2017-12-09 DIAGNOSIS — G4459 Other complicated headache syndrome: Secondary | ICD-10-CM | POA: Diagnosis not present

## 2017-12-09 DIAGNOSIS — J4520 Mild intermittent asthma, uncomplicated: Secondary | ICD-10-CM

## 2017-12-09 DIAGNOSIS — E039 Hypothyroidism, unspecified: Secondary | ICD-10-CM

## 2017-12-09 DIAGNOSIS — A770 Spotted fever due to Rickettsia rickettsii: Secondary | ICD-10-CM | POA: Diagnosis not present

## 2017-12-09 DIAGNOSIS — R110 Nausea: Secondary | ICD-10-CM | POA: Insufficient documentation

## 2017-12-09 MED ORDER — ALBUTEROL SULFATE HFA 108 (90 BASE) MCG/ACT IN AERS: 2.0000 | INHALATION_SPRAY | Freq: Three times a day (TID) | RESPIRATORY_TRACT | 5 refills | Status: DC

## 2017-12-09 NOTE — Progress Notes (Signed)
[facility] [address] [place], [place] [address]  Internal MEDICINE  Office Visit Note  Patient Name: Erin Chan  [id number]  [id number]  Date of Service: 12/09/2017  Chief Complaint  Patient presents with  . Headache    6wk follow up  . Nausea    single episode of vomiting     The patient made visit to the ER due to high fever and confusion. Was diagnosed with [place] Spotted Fever. Treated with doxycycline and has finished the antibiotics. She finished these this past Sunday evening. She feels very tired and continues to have headaches. These were present since prior to this infection. She missed work on June 11, 12, and 13th of June. She also missed June 23rd due to persistent symptoms. She would like to have FMLA paperwork filled out to protect her job. Today, she reports feeling better, overall. She is ready to return to work at full time hours. She needs to have a note stating that she can go back to work.  She did start feeling nauseated last night and had single episode of vomiting. She is tired today. This is new and unrelated to prior episode of [facility] Spotted Fever.       Current Medication: Outpatient Encounter Medications as of 12/09/2017  Medication Sig  . albuterol (PROVENTIL HFA;VENTOLIN HFA) 108 (90 Base) MCG/ACT inhaler Inhale 2 puffs into the lungs 3 (three) times daily.  Marland Kitchen alendronate (FOSAMAX) 70 MG tablet Take 1 tablet (70 mg total) by mouth once a week. Take with a full glass of water on an empty stomach. (Patient taking differently: Take 70 mg by mouth every Wednesday. Take with a full glass of water on an empty stomach. )  . augmented betamethasone dipropionate (DIPROLENE-AF) 0.05 % cream APPLY TO AFFECTED AREA TWICE A DAY FOR DRY ITCHY RASH  . clobetasol cream (TEMOVATE) 0.05 % Apply 1 application topically 2 (two) times daily.  Marland Kitchen levothyroxine (SYNTHROID, LEVOTHROID) 75 MCG tablet TAKE 1 TABLET BY MOUTH ONCE A DAY BEFORE  BREAKFAST  . [DISCONTINUED] albuterol (PROVENTIL HFA;VENTOLIN HFA) 108 (90 Base) MCG/ACT inhaler Inhale 2 puffs into the lungs 3 (three) times daily.  . butalbital-acetaminophen-caffeine (FIORICET, ESGIC) 50-325-40 MG tablet Take 1-2 tablets by mouth every 6 (six) hours as needed for headache. (Patient not taking: Reported on 12/09/2017)  . fluticasone (FLONASE) 50 MCG/ACT nasal spray Place 2 sprays into both nostrils daily. (Patient not taking: Reported on 12/09/2017)   No facility-administered encounter medications on file as of 12/09/2017.     Surgical History: Past Surgical History:  Procedure Laterality Date  . KNEE SURGERY Right   . TUBAL LIGATION      Medical History: Past Medical History:  Diagnosis Date  . Allergy   . Depression   . Thyroid disease     Family History: Family History  Problem Relation Age of Onset  . Lung cancer Mother   . Diabetes Maternal Grandmother   . Heart attack Maternal Grandfather   . Breast cancer Neg Hx     Social History   Socioeconomic History  . Marital status: Divorced    Spouse name: Not on file  . Number of children: Not on file  . Years of education: Not on file  . Highest education level: Not on file  Occupational History  . Not on file  Social Needs  . Financial resource strain: Not on file  . Food insecurity:    Worry: Not on file  Inability: Not on file  . Transportation needs:    Medical: Not on file    Non-medical: Not on file  Tobacco Use  . Smoking status: Never Smoker  . Smokeless tobacco: Never Used  Substance and Sexual Activity  . Alcohol use: Never    Frequency: Never  . Drug use: Never  . Sexual activity: Not on file  Lifestyle  . Physical activity:    Days per week: Not on file    Minutes per session: Not on file  . Stress: Not on file  Relationships  . Social connections:    Talks on phone: Not on file    Gets together: Not on file    Attends religious service: Not on file    Active member of  club or organization: Not on file    Attends meetings of clubs or organizations: Not on file    Relationship status: Not on file  . Intimate partner violence:    Fear of current or ex partner: Not on file    Emotionally abused: Not on file    Physically abused: Not on file    Forced sexual activity: Not on file  Other Topics Concern  . Not on file  Social History Narrative  . Not on file      Review of Systems  Constitutional: Positive for fatigue. Negative for chills, fever and unexpected weight change.  HENT: Negative for congestion, postnasal drip, rhinorrhea, sneezing and sore throat.   Eyes: Negative.  Negative for redness.  Respiratory: Negative for cough, chest tightness and shortness of breath.   Cardiovascular: Negative for chest pain and palpitations.  Gastrointestinal: Positive for nausea and vomiting. Negative for abdominal pain, constipation and diarrhea.  Endocrine: Negative for cold intolerance, heat intolerance, polydipsia, polyphagia and polyuria.  Genitourinary: Negative.  Negative for dysuria and frequency.  Musculoskeletal: Negative for arthralgias, back pain, joint swelling and neck pain.  Skin: Negative for rash.  Allergic/Immunologic: Positive for environmental allergies.  Neurological: Positive for dizziness and headaches. Negative for tremors and numbness.       Intermittent symptoms, mostly when sinuses are acting up. Was given prescription for butalbital/APAP/caff. And has not had to take one since she picked up the bottle.   Hematological: Negative for adenopathy. Does not bruise/bleed easily.  Psychiatric/Behavioral: Positive for dysphoric mood. Negative for behavioral problems (Depression), sleep disturbance and suicidal ideas. The patient is not nervous/anxious.     Today's Vitals   12/09/17 0910  BP: 108/64  Pulse: 76  Resp: 16  SpO2: 96%  Weight: 142 lb 6.4 oz (64.6 kg)  Height: 4\' 11"  (1.499 m)    Physical Exam  Constitutional: She is  oriented to person, place, and time. She appears well-developed and well-nourished. No distress.  HENT:  Head: Normocephalic and atraumatic.  Mouth/Throat: Oropharynx is clear and moist. No oropharyngeal exudate.  Eyes: Pupils are equal, round, and reactive to light. Conjunctivae and EOM are normal.  Neck: Normal range of motion. Neck supple. No JVD present. Carotid bruit is not present. No tracheal deviation present. No thyromegaly present.  Cardiovascular: Normal rate, regular rhythm and normal heart sounds. Exam reveals no gallop and no friction rub.  No murmur heard. Pulmonary/Chest: Effort normal and breath sounds normal. No respiratory distress. She has no wheezes. She has no rales. She exhibits no tenderness.  Abdominal: Soft. Bowel sounds are normal. There is no tenderness.  Musculoskeletal: Normal range of motion.  Lymphadenopathy:    She has no cervical adenopathy.  Neurological:  She is alert and oriented to person, place, and time. She displays normal reflexes. No cranial nerve deficit.  Skin: Skin is warm and dry. Capillary refill takes less than 2 seconds. She is not diaphoretic.  Psychiatric: She has a normal mood and affect. Her behavior is normal. Judgment and thought content normal.  Nursing note and vitals reviewed.   Assessment/Plan:  1. [place] spotted fever Resolved. Patient given note allowing her to return to work full time and without restrictions.   2. Mild intermittent asthma without complication Use rescue inhaler as needed and as prescribed.  - albuterol (PROVENTIL HFA;VENTOLIN HFA) 108 (90 Base) MCG/ACT inhaler; Inhale 2 puffs into the lungs 3 (three) times daily.  Dispense: 1 Inhaler; Refill: 5  3. Other complicated headache syndrome Use prescribed pain reliever to treat acute headaches.   4. Acquired hypothyroidism Continue levothyroxine as prescribed   5. Nausea The 'BRAT' diet is suggested, then progress to diet as tolerated as symptoms abate.  Call if bloody stools, persistent diarrhea, vomiting, fever or abdominal pain.  General Counseling: Erin Chan verbalizes understanding of the findings of todays visit and agrees with plan of treatment. I have discussed any further diagnostic evaluation that may be needed or ordered today. We also reviewed her medications today. she has been encouraged to call the office with any questions or concerns that should arise related to todays visit.   This patient was seen by Vincent GrosHeather Lonell Stamos FNP Collaboration with Dr Lyndon CodeFozia M Khan as a part of collaborative care agreement   Meds ordered this encounter  Medications  . albuterol (PROVENTIL HFA;VENTOLIN HFA) 108 (90 Base) MCG/ACT inhaler    Sig: Inhale 2 puffs into the lungs 3 (three) times daily.    Dispense:  1 Inhaler    Refill:  5    Please fill with alternative preferred by her insurance. Thanks.    Order Specific Question:   Supervising Provider    Answer:   Lyndon CodeKHAN, FOZIA M [[id number]]    Time spent: 1215 Minutes      Dr Lyndon CodeFozia M Khan Internal medicine

## 2018-01-27 ENCOUNTER — Other Ambulatory Visit: Payer: Self-pay | Admitting: Nurse Practitioner

## 2018-01-27 MED ORDER — LEVOTHYROXINE SODIUM 75 MCG PO TABS: ORAL_TABLET | ORAL | 2 refills | Status: DC

## 2018-02-24 ENCOUNTER — Other Ambulatory Visit: Payer: Self-pay | Admitting: Nurse Practitioner

## 2018-03-17 ENCOUNTER — Other Ambulatory Visit: Payer: Self-pay | Admitting: Nurse Practitioner

## 2018-03-25 ENCOUNTER — Ambulatory Visit (INDEPENDENT_AMBULATORY_CARE_PROVIDER_SITE_OTHER): Payer: 59 | Admitting: Nurse Practitioner

## 2018-03-25 ENCOUNTER — Encounter: Payer: Self-pay | Admitting: Nurse Practitioner

## 2018-03-25 VITALS — BP 110/70 | HR 78 | Resp 16 | Ht 59.3 in | Wt 143.0 lb

## 2018-03-25 DIAGNOSIS — Z0001 Encounter for general adult medical examination with abnormal findings: Secondary | ICD-10-CM | POA: Diagnosis not present

## 2018-03-25 DIAGNOSIS — Z124 Encounter for screening for malignant neoplasm of cervix: Secondary | ICD-10-CM | POA: Diagnosis not present

## 2018-03-25 DIAGNOSIS — E039 Hypothyroidism, unspecified: Secondary | ICD-10-CM | POA: Diagnosis not present

## 2018-03-25 DIAGNOSIS — R300 Dysuria: Secondary | ICD-10-CM

## 2018-03-25 DIAGNOSIS — Z1239 Encounter for other screening for malignant neoplasm of breast: Secondary | ICD-10-CM

## 2018-03-25 DIAGNOSIS — M810 Age-related osteoporosis without current pathological fracture: Secondary | ICD-10-CM

## 2018-03-25 DIAGNOSIS — E559 Vitamin D deficiency, unspecified: Secondary | ICD-10-CM | POA: Insufficient documentation

## 2018-03-25 DIAGNOSIS — E2839 Other primary ovarian failure: Secondary | ICD-10-CM

## 2018-03-25 NOTE — Progress Notes (Signed)
[facility] [address] [place], [place] [address]  Internal MEDICINE  Office Visit Note  Patient Name: Erin Chan  [id number]  [id number]  Date of Service: 03/25/2018   Pt is here for routine health maintenance examination   Chief Complaint  Patient presents with  . Annual Exam  . Hypothyroidism    The patient is here for health maintenance exam and pap smear. She is feeling well, overall. Still having intermittent episodes of sadness related to the loss of her son. Last week was his birthday, and the holidays are coming up. She states that she has many more good days than bad. Generally, she is managing well and has a great deal of support from friends and family.  She is due to have routine, fasting labs. She will get flu shot from her pharmacy. She will be due for mammogram in 07/2018. thsi year, we will get bone density test as well.    Current Medication: Outpatient Encounter Medications as of 03/25/2018  Medication Sig  . albuterol (PROVENTIL HFA;VENTOLIN HFA) 108 (90 Base) MCG/ACT inhaler Inhale 2 puffs into the lungs 3 (three) times daily.  Marland Kitchen alendronate (FOSAMAX) 70 MG tablet Take 1 tablet (70 mg total) by mouth once a week. Take with a full glass of water on an empty stomach. (Patient taking differently: Take 70 mg by mouth every Wednesday. Take with a full glass of water on an empty stomach. )  . augmented betamethasone dipropionate (DIPROLENE-AF) 0.05 % cream APPLY TO AFFECTED AREA TWICE A DAY FOR DRY ITCHY RASH  . butalbital-acetaminophen-caffeine (FIORICET, ESGIC) 50-325-40 MG tablet Take 1-2 tablets by mouth every 6 (six) hours as needed for headache.  . clobetasol cream (TEMOVATE) 0.05 % Apply 1 application topically 2 (two) times daily.  Marland Kitchen levothyroxine (SYNTHROID, LEVOTHROID) 75 MCG tablet TAKE 1 TABLET BY MOUTH ONCE A DAY BEFORE BREAKFAST  . [DISCONTINUED] fluticasone (FLONASE) 50 MCG/ACT nasal spray Place 2 sprays into both nostrils daily.  (Patient not taking: Reported on 12/09/2017)   No facility-administered encounter medications on file as of 03/25/2018.     Surgical History: Past Surgical History:  Procedure Laterality Date  . KNEE SURGERY Right   . TUBAL LIGATION      Medical History: Past Medical History:  Diagnosis Date  . Allergy   . Depression   . Thyroid disease     Family History: Family History  Problem Relation Age of Onset  . Lung cancer Mother   . Diabetes Maternal Grandmother   . Heart attack Maternal Grandfather   . Breast cancer Neg Hx       Review of Systems  Constitutional: Negative for chills, fatigue and unexpected weight change.  HENT: Negative for congestion, postnasal drip, rhinorrhea, sneezing and sore throat.   Eyes: Negative.  Negative for redness.  Respiratory: Negative for cough, chest tightness, shortness of breath and wheezing.   Cardiovascular: Negative for chest pain and palpitations.  Gastrointestinal: Negative for abdominal pain, constipation, diarrhea, nausea and vomiting.  Endocrine: Negative for cold intolerance, heat intolerance, polydipsia, polyphagia and polyuria.       Due to have thyroid panel checked.   Genitourinary: Negative.  Negative for dysuria and frequency.  Musculoskeletal: Negative for arthralgias, back pain, joint swelling and neck pain.  Skin: Negative for rash.  Allergic/Immunologic: Positive for environmental allergies.  Neurological: Positive for dizziness and headaches. Negative for tremors and numbness.  Hematological: Negative for adenopathy. Does not bruise/bleed easily.  Psychiatric/Behavioral: Positive for depression and dysphoric mood.  Negative for behavioral problems (Depression), sleep disturbance and suicidal ideas. The patient is nervous/anxious.     Today's Vitals   03/25/18 0903  BP: 110/70  Pulse: 78  Resp: 16  SpO2: 99%  Weight: 143 lb (64.9 kg)  Height: 4' 11.3" (1.506 m)     Physical Exam  Constitutional: She is  oriented to person, place, and time. She appears well-developed and well-nourished. No distress.  HENT:  Head: Normocephalic and atraumatic.  Nose: Nose normal.  Mouth/Throat: No oropharyngeal exudate.  Eyes: Pupils are equal, round, and reactive to light. Conjunctivae and EOM are normal.  Neck: Normal range of motion. Neck supple. No JVD present. Carotid bruit is not present. No tracheal deviation present. No thyromegaly present.  Cardiovascular: Normal rate, regular rhythm, normal heart sounds and intact distal pulses. Exam reveals no gallop and no friction rub.  No murmur heard. Pulmonary/Chest: Effort normal and breath sounds normal. No respiratory distress. She has no wheezes. She has no rales. She exhibits no tenderness. Right breast exhibits no inverted nipple, no mass, no nipple discharge, no skin change and no tenderness. Left breast exhibits no inverted nipple, no mass, no nipple discharge, no skin change and no tenderness.  Abdominal: Soft. Bowel sounds are normal. There is no tenderness.  Genitourinary: Vagina normal and uterus normal. Pelvic exam was performed with patient supine.  Genitourinary Comments: No tenderness, masses, or organomeglay present during bimanual exam .  Musculoskeletal: Normal range of motion.  Lymphadenopathy:    She has no cervical adenopathy.  Neurological: She is alert and oriented to person, place, and time. No cranial nerve deficit.  Skin: Skin is warm and dry. Capillary refill takes less than 2 seconds. She is not diaphoretic.  Psychiatric: She has a normal mood and affect. Her behavior is normal. Judgment and thought content normal.  Nursing note and vitals reviewed.  Assessment/Plan:  1. Encounter for general adult medical examination with abnormal findings Annual health maintenance exam today - CBC with Differential/Platelet - Comprehensive metabolic panel - Lipid panel  2. Acquired hypothyroidism Check thyroid panel and adjust levothyroxine  as indicated.  - Comprehensive metabolic panel - T4, free - TSH  3. Age-related osteoporosis without current pathological fracture Bone density test to be done 07/2018. Will adjust medication treatment as indicated.  - DG Bone Density; Future  4. Vitamin D deficiency - Vitamin D 1,25 dihydroxy  5. Routine cervical smear - Pap IG and HPV (high risk) DNA detection  6. Ovarian failure - DG Bone Density; Future  7. Screening for breast cancer - MM DIGITAL SCREENING BILATERAL; Future  8. Dysuria - UA/M w/rflx Culture, Routine  General Counseling: Faten verbalizes understanding of the findings of todays visit and agrees with plan of treatment. I have discussed any further diagnostic evaluation that may be needed or ordered today. We also reviewed her medications today. she has been encouraged to call the office with any questions or concerns that should arise related to todays visit.    Counseling:  This patient was seen by Vincent GrosHeather Olga Bourbeau FNP Collaboration with Dr Lyndon CodeFozia M Khan as a part of collaborative care agreement  Orders Placed This Encounter  Procedures  . MM DIGITAL SCREENING BILATERAL  . DG Bone Density  . UA/M w/rflx Culture, Routine  . CBC with Differential/Platelet  . Comprehensive metabolic panel  . T4, free  . TSH  . Lipid panel  . Vitamin D 1,25 dihydroxy     Time spent: 30 Minutes      [place]reece[place]ia  Berna Spare, MD  Internal Medicine

## 2018-03-26 LAB — UA/M W/RFLX CULTURE, ROUTINE
Bilirubin, UA: NEGATIVE
GLUCOSE, UA: NEGATIVE
Ketones, UA: NEGATIVE
LEUKOCYTES UA: NEGATIVE
Nitrite, UA: NEGATIVE
PH UA: 5.5 (ref 5.0–7.5)
PROTEIN UA: NEGATIVE
RBC, UA: NEGATIVE
SPEC GRAV UA: 1.007 (ref 1.005–1.030)
Urobilinogen, Ur: 0.2 mg/dL (ref 0.2–1.0)

## 2018-03-26 LAB — MICROSCOPIC EXAMINATION
BACTERIA UA: NONE SEEN
CASTS: NONE SEEN /LPF
Epithelial Cells (non renal): NONE SEEN /hpf (ref 0–10)
WBC, UA: NONE SEEN /hpf (ref 0–5)

## 2018-03-30 LAB — PAP IG AND HPV HIGH-RISK: HPV, high-risk: NEGATIVE

## 2018-07-21 ENCOUNTER — Other Ambulatory Visit: Payer: Self-pay

## 2018-07-21 MED ORDER — BETAMETHASONE DIPROPIONATE AUG 0.05 % EX CREA: TOPICAL_CREAM | CUTANEOUS | 0 refills | Status: DC

## 2018-08-25 ENCOUNTER — Other Ambulatory Visit: Payer: No Typology Code available for payment source

## 2018-09-08 ENCOUNTER — Ambulatory Visit: Payer: 59 | Admitting: Nurse Practitioner

## 2018-09-08 ENCOUNTER — Other Ambulatory Visit: Payer: Self-pay

## 2018-09-08 ENCOUNTER — Encounter: Payer: Self-pay | Admitting: Nurse Practitioner

## 2018-09-08 DIAGNOSIS — F411 Generalized anxiety disorder: Secondary | ICD-10-CM

## 2018-09-08 DIAGNOSIS — R0602 Shortness of breath: Secondary | ICD-10-CM

## 2018-09-08 MED ORDER — SERTRALINE HCL 25 MG PO TABS: 25.0000 mg | ORAL_TABLET | Freq: Every day | ORAL | 3 refills | Status: DC

## 2018-09-08 NOTE — Progress Notes (Signed)
[facility] [address] [place], [place] [address]  Internal MEDICINE  Telephone Visit  Patient Name: Erin SkeensBelinda A Fryer  [id number]  [id number]  Date of Service: 09/08/2018  I connected with the patient at 4:30pm by telephone and verified the patients identity using two identifiers.   I discussed the limitations, risks, security and privacy concerns of performing an evaluation and management service by telephone and the availability of in person appointments. I also discussed with the patient that there may be a patient responsible charge related to the service.  The patient expressed understanding and agrees to proceed.    Chief Complaint  Patient presents with  . Telephone Screen    PHONE VISIT [phone]  . Telephone Assessment  . Shortness of Breath  . Anxiety    The patient has been contacted via telephone for follow up visit due to concerns for spread of novel coronavirus. The patient states that she is having increased anxiety. Has many family stressors. Next month, it will be the five year anniversary of her son passing away. She feels a lot of grief. Her daughter also continues to grieve and she feels like she should be able to be strong for her daughter, and isn't able to due to the amount of anxiety she is feeling herself. She states that she has had a few times whe the anxiety has been so bad, it causes her to feel tight in the chest and short of breath. Subsides on it's own with deep breaths jsut happening more frequently than normal.       Current Medication: Outpatient Encounter Medications as of 09/08/2018  Medication Sig  . albuterol (PROVENTIL HFA;VENTOLIN HFA) 108 (90 Base) MCG/ACT inhaler Inhale 2 puffs into the lungs 3 (three) times daily.  Marland Kitchen. alendronate (FOSAMAX) 70 MG tablet Take 1 tablet (70 mg total) by mouth once a week. Take with a full glass of water on an empty stomach. (Patient taking differently: Take 70 mg by mouth every Wednesday. Take  with a full glass of water on an empty stomach. )  . augmented betamethasone dipropionate (DIPROLENE-AF) 0.05 % cream APPLY TO AFFECTED AREA TWICE A DAY FOR DRY ITCHY RASH  . butalbital-acetaminophen-caffeine (FIORICET, ESGIC) 50-325-40 MG tablet Take 1-2 tablets by mouth every 6 (six) hours as needed for headache.  . clobetasol cream (TEMOVATE) 0.05 % Apply 1 application topically 2 (two) times daily.  Marland Kitchen. levothyroxine (SYNTHROID, LEVOTHROID) 75 MCG tablet TAKE 1 TABLET BY MOUTH ONCE A DAY BEFORE BREAKFAST  . sertraline (ZOLOFT) 25 MG tablet Take 1 tablet (25 mg total) by mouth daily.   No facility-administered encounter medications on file as of 09/08/2018.     Surgical History: Past Surgical History:  Procedure Laterality Date  . KNEE SURGERY Right   . TUBAL LIGATION      Medical History: Past Medical History:  Diagnosis Date  . Allergy   . Depression   . Thyroid disease     Family History: Family History  Problem Relation Age of Onset  . Lung cancer Mother   . Diabetes Maternal Grandmother   . Heart attack Maternal Grandfather   . Breast cancer Neg Hx     Social History   Socioeconomic History  . Marital status: Divorced    Spouse name: Not on file  . Number of children: Not on file  . Years of education: Not on file  . Highest education level: Not on file  Occupational History  . Not on file  Social  Needs  . Financial resource strain: Not on file  . Food insecurity:    Worry: Not on file    Inability: Not on file  . Transportation needs:    Medical: Not on file    Non-medical: Not on file  Tobacco Use  . Smoking status: Never Smoker  . Smokeless tobacco: Never Used  Substance and Sexual Activity  . Alcohol use: Never    Frequency: Never  . Drug use: Never  . Sexual activity: Not on file  Lifestyle  . Physical activity:    Days per week: Not on file    Minutes per session: Not on file  . Stress: Not on file  Relationships  . Social connections:     Talks on phone: Not on file    Gets together: Not on file    Attends religious service: Not on file    Active member of club or organization: Not on file    Attends meetings of clubs or organizations: Not on file    Relationship status: Not on file  . Intimate partner violence:    Fear of current or ex partner: Not on file    Emotionally abused: Not on file    Physically abused: Not on file    Forced sexual activity: Not on file  Other Topics Concern  . Not on file  Social History Narrative  . Not on file      Review of Systems  Constitutional: Positive for fatigue. Negative for chills and unexpected weight change.  HENT: Negative for congestion, postnasal drip, rhinorrhea, sneezing and sore throat.   Respiratory: Negative for cough, chest tightness and shortness of breath.   Cardiovascular: Negative for chest pain and palpitations.  Gastrointestinal: Negative for abdominal pain, constipation, diarrhea, nausea and vomiting.  Musculoskeletal: Negative for arthralgias, back pain, joint swelling and neck pain.  Skin: Negative for rash.  Neurological: Negative for dizziness, tremors, numbness and headaches.  Hematological: Negative for adenopathy. Does not bruise/bleed easily.  Psychiatric/Behavioral: Positive for dysphoric mood. Negative for behavioral problems (Depression), sleep disturbance and suicidal ideas. The patient is nervous/anxious.     Vital Signs: There were no vitals taken for this visit.   Observation/Objective:   The patient is alert and oriented. She is pleasant and answers all questions appropriately. Breathing is non-labored. She is in no acute distress at this time. She does sound anxious and worried on the phone.    Assessment/Plan: 1. Shortness of breath Related to anxiety and increased grief due to the loss of her son. Will monitor.   2. Generalized anxiety disorder Start sertraline 25mg  every evening. Discussed coping mechanisms to help deal with  stress and anxiety. Will see patient back in two weeks for reassessment.  - sertraline (ZOLOFT) 25 MG tablet; Take 1 tablet (25 mg total) by mouth daily.  Dispense: 30 tablet; Refill: 3  General Counseling: Quinisha verbalizes understanding of the findings of today's phone visit and agrees with plan of treatment. I have discussed any further diagnostic evaluation that may be needed or ordered today. We also reviewed her medications today. she has been encouraged to call the office with any questions or concerns that should arise related to todays visit.  This patient was seen by Vincent Gros FNP Collaboration with Dr Lyndon Code as a part of collaborative care agreement  Meds ordered this encounter  Medications  . sertraline (ZOLOFT) 25 MG tablet    Sig: Take 1 tablet (25 mg total) by mouth daily.  Dispense:  30 tablet    Refill:  3    Order Specific Question:   Supervising Provider    Answer:   Lyndon Code [[id number]]    Time spent: 45 Minutes    Dr Lyndon Code Internal medicine

## 2018-09-22 ENCOUNTER — Ambulatory Visit: Payer: 59 | Admitting: Nurse Practitioner

## 2018-09-22 ENCOUNTER — Other Ambulatory Visit: Payer: Self-pay

## 2018-09-22 ENCOUNTER — Encounter: Payer: Self-pay | Admitting: Nurse Practitioner

## 2018-09-22 VITALS — Ht 60.0 in | Wt 130.0 lb

## 2018-09-22 DIAGNOSIS — F411 Generalized anxiety disorder: Secondary | ICD-10-CM | POA: Diagnosis not present

## 2018-09-22 DIAGNOSIS — M810 Age-related osteoporosis without current pathological fracture: Secondary | ICD-10-CM | POA: Diagnosis not present

## 2018-09-22 DIAGNOSIS — E039 Hypothyroidism, unspecified: Secondary | ICD-10-CM

## 2018-09-22 NOTE — Progress Notes (Signed)
[facility] [address] [place], [place] [address]  Internal MEDICINE  Telephone Visit  Patient Name: Erin Chan  [id number]  [id number]  Date of Service: 09/22/2018  I connected with the patient at 11:28am by telephone and verified the patients identity using two identifiers.   I discussed the limitations, risks, security and privacy concerns of performing an evaluation and management service by telephone and the availability of in person appointments. I also discussed with the patient that there may be a patient responsible charge related to the service.  The patient expressed understanding and agrees to proceed.    Chief Complaint  Patient presents with  . Telephone Screen    PHONE VISIT [phone]  . Telephone Assessment  . Medical Management of Chronic Issues    6 month follow up  . Hypothyroidism  . Depression  . Quality Metric Gaps    pt did not get the flu vaccine    The patient has been contacted via telephone for follow up visit due to concerns for spread of novel coronavirus. Was started on low dose sertraline at her last visit. The patient states that she had been having increased anxiety. Has many family stressors. Next month, it will be the five year anniversary of her son passing away. She feels a lot of grief. Her daughter also continues to grieve and she feels like she should be able to be strong for her daughter, and isn't able to due to the amount of anxiety she is feeling herself. She states that she has had a few times whe the anxiety has been so bad, it causes her to feel tight in the chest and short of breath. Subsides on it's own with deep breaths jsut happening more frequently than normal. She was started on low dose sertraline at her last visit. States that she is doing much better. She is less irritable and she is less tearful. She states that she is also sleeping much better. Having no negative side effects from taking this medication.  She  states that she has right arm pain, just in the long bone from the shoulder to the elbow. Hurts when she stretches it out and exerts weight on the right arm. She does not remember injury or trama to the area. She has not taken anything to help pain nor has she iced or tried heat to reduce pain.        Current Medication: Outpatient Encounter Medications as of 09/22/2018  Medication Sig  . albuterol (PROVENTIL HFA;VENTOLIN HFA) 108 (90 Base) MCG/ACT inhaler Inhale 2 puffs into the lungs 3 (three) times daily.  Marland Kitchen. alendronate (FOSAMAX) 70 MG tablet Take 1 tablet (70 mg total) by mouth once a week. Take with a full glass of water on an empty stomach. (Patient taking differently: Take 70 mg by mouth every Wednesday. Take with a full glass of water on an empty stomach. )  . augmented betamethasone dipropionate (DIPROLENE-AF) 0.05 % cream APPLY TO AFFECTED AREA TWICE A DAY FOR DRY ITCHY RASH  . butalbital-acetaminophen-caffeine (FIORICET, ESGIC) 50-325-40 MG tablet Take 1-2 tablets by mouth every 6 (six) hours as needed for headache.  . clobetasol cream (TEMOVATE) 0.05 % Apply 1 application topically 2 (two) times daily.  Marland Kitchen. levothyroxine (SYNTHROID, LEVOTHROID) 75 MCG tablet TAKE 1 TABLET BY MOUTH ONCE A DAY BEFORE BREAKFAST  . sertraline (ZOLOFT) 25 MG tablet Take 1 tablet (25 mg total) by mouth daily.   No facility-administered encounter medications on file as of  09/22/2018.     Surgical History: Past Surgical History:  Procedure Laterality Date  . KNEE SURGERY Right   . TUBAL LIGATION      Medical History: Past Medical History:  Diagnosis Date  . Allergy   . Depression   . Thyroid disease     Family History: Family History  Problem Relation Age of Onset  . Lung cancer Mother   . Diabetes Maternal Grandmother   . Heart attack Maternal Grandfather   . Breast cancer Neg Hx     Social History   Socioeconomic History  . Marital status: Divorced    Spouse name: Not on file  .  Number of children: Not on file  . Years of education: Not on file  . Highest education level: Not on file  Occupational History  . Not on file  Social Needs  . Financial resource strain: Not on file  . Food insecurity:    Worry: Not on file    Inability: Not on file  . Transportation needs:    Medical: Not on file    Non-medical: Not on file  Tobacco Use  . Smoking status: Never Smoker  . Smokeless tobacco: Never Used  Substance and Sexual Activity  . Alcohol use: Never    Frequency: Never  . Drug use: Never  . Sexual activity: Not on file  Lifestyle  . Physical activity:    Days per week: Not on file    Minutes per session: Not on file  . Stress: Not on file  Relationships  . Social connections:    Talks on phone: Not on file    Gets together: Not on file    Attends religious service: Not on file    Active member of club or organization: Not on file    Attends meetings of clubs or organizations: Not on file    Relationship status: Not on file  . Intimate partner violence:    Fear of current or ex partner: Not on file    Emotionally abused: Not on file    Physically abused: Not on file    Forced sexual activity: Not on file  Other Topics Concern  . Not on file  Social History Narrative  . Not on file      Review of Systems  Constitutional: Positive for fatigue. Negative for chills and unexpected weight change.  HENT: Negative for congestion, postnasal drip, rhinorrhea, sneezing and sore throat.   Respiratory: Negative for cough, chest tightness and shortness of breath.   Cardiovascular: Negative for chest pain and palpitations.  Gastrointestinal: Negative for abdominal pain, constipation, diarrhea, nausea and vomiting.  Musculoskeletal: Positive for arthralgias and myalgias. Negative for back pain, joint swelling and neck pain.       Right arm from the shoulder to the elbow.   Skin: Negative for rash.  Neurological: Negative for dizziness, tremors, numbness  and headaches.  Hematological: Negative for adenopathy. Does not bruise/bleed easily.  Psychiatric/Behavioral: Positive for dysphoric mood. Negative for behavioral problems (Depression), sleep disturbance and suicidal ideas. The patient is nervous/anxious.        Improving symptoms     Today's Vitals   09/22/18 1047  Weight: 130 lb (59 kg)  Height: 5' (1.524 m)   Body mass index is 25.39 kg/m.   Observation/Objective:    The patient is alert and oriented. She is pleasant and answers all questions appropriately. Breathing is non-labored. She is in no acute distress at this time.    Assessment/Plan:  1. Acquired hypothyroidism Check thyroid panel and adjust levothyroxine as indicated. Lab order to be mailed to the patient.   2. Age-related osteoporosis without current pathological fracture Patient scheduled for bone density test. Will review results with patient at her next visit.   3. Generalized anxiety disorder Improved. Continue sertraline  every day.   General Counseling: [place] verbalizes understanding of the findings of today's phone visit and agrees with plan of treatment. I have discussed any further diagnostic evaluation that may be needed or ordered today. We also reviewed her medications today. she has been encouraged to call the office with any questions or concerns that should arise related to todays visit.  This patient was seen by Vincent Gros FNP Collaboration with Dr Lyndon Code as a part of collaborative care agreement  Time spent: 25 Minutes there was 15 minutes spent reviewing chart with the patient.     Dr Lyndon Code Internal medicine

## 2018-10-01 ENCOUNTER — Other Ambulatory Visit: Payer: Self-pay | Admitting: Nurse Practitioner

## 2018-10-01 DIAGNOSIS — F411 Generalized anxiety disorder: Secondary | ICD-10-CM

## 2018-10-09 ENCOUNTER — Ambulatory Visit
Admission: RE | Admit: 2018-10-09 | Discharge: 2018-10-09 | Disposition: A | Discharge status: Home / Self Care | Payer: 59 | Source: Ambulatory Visit | Attending: Nurse Practitioner | Admitting: Nurse Practitioner

## 2018-10-09 ENCOUNTER — Other Ambulatory Visit: Payer: Self-pay

## 2018-10-09 DIAGNOSIS — M810 Age-related osteoporosis without current pathological fracture: Secondary | ICD-10-CM

## 2018-10-09 DIAGNOSIS — Z1239 Encounter for other screening for malignant neoplasm of breast: Secondary | ICD-10-CM | POA: Insufficient documentation

## 2018-10-09 DIAGNOSIS — E2839 Other primary ovarian failure: Secondary | ICD-10-CM

## 2018-10-13 ENCOUNTER — Ambulatory Visit: Payer: 59 | Admitting: Nurse Practitioner

## 2018-10-13 ENCOUNTER — Other Ambulatory Visit: Payer: Self-pay

## 2018-10-13 ENCOUNTER — Encounter: Payer: Self-pay | Admitting: Nurse Practitioner

## 2018-10-13 VITALS — BP 122/85 | HR 78 | Temp 98.2°F | Resp 16 | Ht 60.0 in | Wt 149.0 lb

## 2018-10-13 DIAGNOSIS — H9201 Otalgia, right ear: Secondary | ICD-10-CM

## 2018-10-13 DIAGNOSIS — M85859 Other specified disorders of bone density and structure, unspecified thigh: Secondary | ICD-10-CM

## 2018-10-13 DIAGNOSIS — F411 Generalized anxiety disorder: Secondary | ICD-10-CM

## 2018-10-13 NOTE — Progress Notes (Signed)
[facility] [address] [place], [place] [address]  Internal MEDICINE  Office Visit Note  Patient Name: Erin SkeensBelinda A Balthazar  [id number]  [id number]  Date of Service: 10/14/2018   Pt is here for a sick visit.  Chief Complaint  Patient presents with  . Ear Problem    right ear , felt something in the ear , not painful , pt had bone density test would like the results      The patient states that she was scratching the right ear. She states that she felt something in her ear. Does not hurt. She has not noted any drainage from the ear. She denies congestion. Has no headache or fever. The patient did have bone density and mammogram last week. She would like to review the results while she is here.        Current Medication:  Outpatient Encounter Medications as of 10/13/2018  Medication Sig  . albuterol (PROVENTIL HFA;VENTOLIN HFA) 108 (90 Base) MCG/ACT inhaler Inhale 2 puffs into the lungs 3 (three) times daily.  Marland Kitchen. alendronate (FOSAMAX) 70 MG tablet Take 1 tablet (70 mg total) by mouth once a week. Take with a full glass of water on an empty stomach. (Patient taking differently: Take 70 mg by mouth every Wednesday. Take with a full glass of water on an empty stomach. )  . augmented betamethasone dipropionate (DIPROLENE-AF) 0.05 % cream APPLY TO AFFECTED AREA TWICE A DAY FOR DRY ITCHY RASH  . butalbital-acetaminophen-caffeine (FIORICET, ESGIC) 50-325-40 MG tablet Take 1-2 tablets by mouth every 6 (six) hours as needed for headache.  . clobetasol cream (TEMOVATE) 0.05 % Apply 1 application topically 2 (two) times daily.  Marland Kitchen. levothyroxine (SYNTHROID, LEVOTHROID) 75 MCG tablet TAKE 1 TABLET BY MOUTH ONCE A DAY BEFORE BREAKFAST  . sertraline (ZOLOFT) 25 MG tablet TAKE 1 TABLET BY MOUTH EVERY DAY   No facility-administered encounter medications on file as of 10/13/2018.       Medical History: Past Medical History:  Diagnosis Date  . Allergy   . Depression   . Thyroid  disease      Today's Vitals   10/13/18 1358  BP: 122/85  Pulse: 78  Resp: 16  Temp: 98.2 F (36.8 C)  SpO2: 96%  Weight: 149 lb (67.6 kg)  Height: 5' (1.524 m)   Body mass index is 29.1 kg/m.  Review of Systems  Constitutional: Negative for chills, fatigue and unexpected weight change.  HENT: Negative for congestion, postnasal drip, rhinorrhea, sneezing and sore throat.        Feels something in the right ear that should not be there. Does not hurt. Does not drain. Can hear out of the ears without problem.  Respiratory: Negative for cough, chest tightness and shortness of breath.   Cardiovascular: Negative for chest pain and palpitations.  Gastrointestinal: Negative for abdominal pain, constipation, diarrhea, nausea and vomiting.  Musculoskeletal: Positive for arthralgias and myalgias. Negative for back pain, joint swelling and neck pain.       Right arm from the shoulder to the elbow.   Skin: Negative for rash.  Neurological: Negative for dizziness, tremors, numbness and headaches.  Hematological: Negative for adenopathy. Does not bruise/bleed easily.  Psychiatric/Behavioral: Positive for dysphoric mood. Negative for behavioral problems (Depression), sleep disturbance and suicidal ideas. The patient is nervous/anxious.        Slight depression due to upcoming anniversary of her son's death    Physical Exam Vitals signs and nursing note reviewed.  Constitutional:  General: She is not in acute distress.    Appearance: Normal appearance. She is well-developed. She is not diaphoretic.  HENT:     Head: Normocephalic and atraumatic.     Right Ear: Ear canal normal. Tympanic membrane is bulging.     Left Ear: Ear canal normal. Tympanic membrane is bulging.     Mouth/Throat:     Pharynx: No oropharyngeal exudate.  Eyes:     Pupils: Pupils are equal, round, and reactive to light.  Neck:     Musculoskeletal: Normal range of motion and neck supple.     Thyroid: No  thyromegaly.     Vascular: No JVD.     Trachea: No tracheal deviation.  Cardiovascular:     Rate and Rhythm: Normal rate and regular rhythm.     Heart sounds: Normal heart sounds. No murmur. No friction rub. No gallop.   Pulmonary:     Effort: Pulmonary effort is normal. No respiratory distress.     Breath sounds: Normal breath sounds. No wheezing or rales.  Chest:     Chest wall: No tenderness.  Abdominal:     General: Bowel sounds are normal.     Palpations: Abdomen is soft.  Musculoskeletal: Normal range of motion.        General: Tenderness present.     Comments: Tenderness of right shoulder present in the right AC joint area. ROM and strength are slightly limited due to pain. No palpable abnormalities or deformities are noted at this time   Lymphadenopathy:     Cervical: No cervical adenopathy.  Skin:    General: Skin is warm and dry.  Neurological:     Mental Status: She is alert and oriented to person, place, and time.     Cranial Nerves: No cranial nerve deficit.  Psychiatric:        Behavior: Behavior normal.        Thought Content: Thought content normal.        Judgment: Judgment normal.   Assessment/Plan:  1. Otalgia of right ear No abnormalities noted at this time. Will continue to monitor. Advised   2. Osteopenia of neck of femur, unspecified laterality Reviewed results of bone density with the patient. Improved. Showing osteopenia in femur neck and forearm. Will stop fosamax for now and change over to calcium with vitamin d supplement every day. Repeat study in 2 years.   3. Generalized anxiety disorder Improved. Continue sertraline as prescribed.   General Counseling: Aamani verbalizes understanding of the findings of todays visit and agrees with plan of treatment. I have discussed any further diagnostic evaluation that may be needed or ordered today. We also reviewed her medications today. she has been encouraged to call the office with any questions or  concerns that should arise related to todays visit.    Counseling:  This patient was seen by Leretha Pol FNP Collaboration with Dr Lavera Guise as a part of collaborative care agreement   Time spent: 25  Minutes

## 2018-10-14 ENCOUNTER — Other Ambulatory Visit: Payer: Self-pay

## 2018-10-14 DIAGNOSIS — M810 Age-related osteoporosis without current pathological fracture: Secondary | ICD-10-CM

## 2018-10-14 MED ORDER — ALENDRONATE SODIUM 70 MG PO TABS: 70.0000 mg | ORAL_TABLET | ORAL | 1 refills | Status: DC

## 2018-10-22 ENCOUNTER — Other Ambulatory Visit: Payer: Self-pay

## 2018-10-22 MED ORDER — LEVOTHYROXINE SODIUM 75 MCG PO TABS: ORAL_TABLET | ORAL | 1 refills | Status: DC

## 2018-11-20 ENCOUNTER — Other Ambulatory Visit: Payer: Self-pay

## 2018-11-20 MED ORDER — BETAMETHASONE DIPROPIONATE AUG 0.05 % EX CREA: TOPICAL_CREAM | CUTANEOUS | 0 refills | Status: DC

## 2018-11-26 ENCOUNTER — Ambulatory Visit: Payer: 59 | Admitting: Nurse Practitioner

## 2018-11-26 ENCOUNTER — Other Ambulatory Visit: Payer: Self-pay

## 2019-02-04 ENCOUNTER — Ambulatory Visit: Payer: 59 | Admitting: Nurse Practitioner

## 2019-02-04 ENCOUNTER — Encounter: Payer: Self-pay | Admitting: Nurse Practitioner

## 2019-02-04 ENCOUNTER — Other Ambulatory Visit: Payer: Self-pay

## 2019-02-04 VITALS — BP 119/73 | HR 68 | Temp 98.0°F | Resp 16 | Ht 60.0 in | Wt 150.2 lb

## 2019-02-04 DIAGNOSIS — M85859 Other specified disorders of bone density and structure, unspecified thigh: Secondary | ICD-10-CM | POA: Diagnosis not present

## 2019-02-04 DIAGNOSIS — E039 Hypothyroidism, unspecified: Secondary | ICD-10-CM

## 2019-02-04 DIAGNOSIS — J4520 Mild intermittent asthma, uncomplicated: Secondary | ICD-10-CM

## 2019-02-04 DIAGNOSIS — L209 Atopic dermatitis, unspecified: Secondary | ICD-10-CM | POA: Diagnosis not present

## 2019-02-04 MED ORDER — ALBUTEROL SULFATE HFA 108 (90 BASE) MCG/ACT IN AERS: 2.0000 | INHALATION_SPRAY | Freq: Three times a day (TID) | RESPIRATORY_TRACT | 3 refills | Status: DC

## 2019-02-04 MED ORDER — BETAMETHASONE DIPROPIONATE AUG 0.05 % EX CREA: TOPICAL_CREAM | CUTANEOUS | 3 refills | Status: DC

## 2019-02-04 NOTE — Progress Notes (Signed)
[facility] [place], [place] [address]  Internal MEDICINE  Office Visit Note  Patient Name: Erin Chan  [id number]  [id number]  Date of Service: 02/04/2019  Chief Complaint  Patient presents with  . Hypothyroidism  . Medication Refill    rash cream    The patient is here for routine follow up visit. She states that she is doing well overall. She states that she takes sertraline very intermittently. Only when she is having a bad day. Does make her sleepy. When she takes it, she does very well. She is getting ready to take an early retirement. She needs to get routine, fasting labs done prior to retirement prior to losing insurance from retirement. In June she had mammogram and bone density test. Her bone density is improved. She is osteopenic in femur with normal bone density of the forearm. She has stopped taking foramax. She has started taking calcium/vitamin D supplement twice daily. Mammogram was negative.       Current Medication: Outpatient Encounter Medications as of 02/04/2019  Medication Sig  . albuterol (VENTOLIN HFA) 108 (90 Base) MCG/ACT inhaler Inhale 2 puffs into the lungs 3 (three) times daily.  Marland Kitchen augmented betamethasone dipropionate (DIPROLENE-AF) 0.05 % cream APPLY TO AFFECTED AREA TWICE A DAY FOR DRY ITCHY RASH  . CALCIUM CITRATE-VITAMIN D PO Take by mouth daily.  . clobetasol cream (TEMOVATE) 3.32 % Apply 1 application topically 2 (two) times daily.  Marland Kitchen levothyroxine (SYNTHROID) 75 MCG tablet TAKE 1 TABLET BY MOUTH ONCE A DAY BEFORE BREAKFAST  . sertraline (ZOLOFT) 25 MG tablet TAKE 1 TABLET BY MOUTH EVERY DAY  . [DISCONTINUED] albuterol (PROVENTIL HFA;VENTOLIN HFA) 108 (90 Base) MCG/ACT inhaler Inhale 2 puffs into the lungs 3 (three) times daily.  . [DISCONTINUED] augmented betamethasone dipropionate (DIPROLENE-AF) 0.05 % cream APPLY TO AFFECTED AREA TWICE A DAY FOR DRY ITCHY RASH  . [DISCONTINUED] alendronate (FOSAMAX) 70 MG tablet  Take 1 tablet (70 mg total) by mouth once a week. Take with a full glass of water on an empty stomach. (Patient not taking: Reported on 02/04/2019)   No facility-administered encounter medications on file as of 02/04/2019.     Surgical History: Past Surgical History:  Procedure Laterality Date  . KNEE SURGERY Right   . TUBAL LIGATION      Medical History: Past Medical History:  Diagnosis Date  . Allergy   . Depression   . Thyroid disease     Family History: Family History  Problem Relation Age of Onset  . Lung cancer Mother   . Diabetes Maternal Grandmother   . Heart attack Maternal Grandfather   . Breast cancer Neg Hx     Social History   Socioeconomic History  . Marital status: Divorced    Spouse name: Not on file  . Number of children: Not on file  . Years of education: Not on file  . Highest education level: Not on file  Occupational History  . Not on file  Social Needs  . Financial resource strain: Not on file  . Food insecurity    Worry: Not on file    Inability: Not on file  . Transportation needs    Medical: Not on file    Non-medical: Not on file  Tobacco Use  . Smoking status: Never Smoker  . Smokeless tobacco: Never Used  Substance and Sexual Activity  . Alcohol use: Never    Frequency: Never  . Drug use: Never  . Sexual activity:  Not on file  Lifestyle  . Physical activity    Days per week: Not on file    Minutes per session: Not on file  . Stress: Not on file  Relationships  . Social Musicianconnections    Talks on phone: Not on file    Gets together: Not on file    Attends religious service: Not on file    Active member of club or organization: Not on file    Attends meetings of clubs or organizations: Not on file    Relationship status: Not on file  . Intimate partner violence    Fear of current or ex partner: Not on file    Emotionally abused: Not on file    Physically abused: Not on file    Forced sexual activity: Not on file  Other  Topics Concern  . Not on file  Social History Narrative  . Not on file      Review of Systems  Constitutional: Negative for chills, fatigue and unexpected weight change.  HENT: Negative for congestion, postnasal drip, rhinorrhea, sneezing and sore throat.   Respiratory: Negative for cough, chest tightness and shortness of breath.   Cardiovascular: Negative for chest pain and palpitations.  Gastrointestinal: Negative for abdominal pain, constipation, diarrhea, nausea and vomiting.  Endocrine: Negative for cold intolerance, heat intolerance, polydipsia and polyuria.       History of hypothyroid. Time to have thyroid panel checked.  Musculoskeletal: Negative for arthralgias, back pain, joint swelling and neck pain.  Skin: Negative for rash.  Neurological: Negative for dizziness, tremors, numbness and headaches.  Hematological: Negative for adenopathy. Does not bruise/bleed easily.  Psychiatric/Behavioral: Positive for dysphoric mood. Negative for behavioral problems (Depression), sleep disturbance and suicidal ideas. The patient is nervous/anxious.    Today's Vitals   02/04/19 1402  BP: 119/73  Pulse: 68  Resp: 16  Temp: 98 F (36.7 C)  SpO2: 98%  Weight: 150 lb 3.2 oz (68.1 kg)  Height: 5' (1.524 m)   Body mass index is 29.33 kg/m.  Physical Exam Vitals signs and nursing note reviewed.  Constitutional:      General: She is not in acute distress.    Appearance: Normal appearance. She is well-developed. She is not diaphoretic.  HENT:     Head: Normocephalic and atraumatic.     Mouth/Throat:     Pharynx: No oropharyngeal exudate.  Eyes:     Conjunctiva/sclera: Conjunctivae normal.     Pupils: Pupils are equal, round, and reactive to light.  Neck:     Musculoskeletal: Normal range of motion and neck supple.     Thyroid: No thyromegaly.     Vascular: No carotid bruit or JVD.     Trachea: No tracheal deviation.  Cardiovascular:     Rate and Rhythm: Normal rate and  regular rhythm.     Heart sounds: Normal heart sounds. No murmur. No friction rub. No gallop.   Pulmonary:     Effort: Pulmonary effort is normal. No respiratory distress.     Breath sounds: Normal breath sounds. No wheezing or rales.  Chest:     Chest wall: No tenderness.  Abdominal:     Palpations: Abdomen is soft.  Musculoskeletal: Normal range of motion.  Lymphadenopathy:     Cervical: No cervical adenopathy.  Skin:    General: Skin is warm and dry.     Capillary Refill: Capillary refill takes less than 2 seconds.  Neurological:     Mental Status: She is alert and  oriented to person, place, and time.     Cranial Nerves: No cranial nerve deficit.     Deep Tendon Reflexes: Reflexes normal.  Psychiatric:        Behavior: Behavior normal.        Thought Content: Thought content normal.        Judgment: Judgment normal.    Assessment/Plan: 1. Mild intermittent asthma without complication Use rescue inhaler as needed and as prescribed  - albuterol (VENTOLIN HFA) 108 (90 Base) MCG/ACT inhaler; Inhale 2 puffs into the lungs 3 (three) times daily.  Dispense: 18 g; Refill: 3  2. Osteopenia of neck of femur, unspecified laterality Improved according to most recent bone density test. Continue with calcium and vitamin d supplement daily.   3. Acquired hypothyroidism Check thyroid panel and adjust levothyroxine as indicated   4. Atopic dermatitis, unspecified type - augmented betamethasone dipropionate (DIPROLENE-AF) 0.05 % cream; APPLY TO AFFECTED AREA TWICE A DAY FOR DRY ITCHY RASH  Dispense: 45 g; Refill: 3  General Counseling: Rainna verbalizes understanding of the findings of todays visit and agrees with plan of treatment. I have discussed any further diagnostic evaluation that may be needed or ordered today. We also reviewed her medications today. she has been encouraged to call the office with any questions or concerns that should arise related to todays visit.  This patient  was seen by Vincent Gros FNP Collaboration with Dr Lyndon Code as a part of collaborative care agreement  Meds ordered this encounter  Medications  . augmented betamethasone dipropionate (DIPROLENE-AF) 0.05 % cream    Sig: APPLY TO AFFECTED AREA TWICE A DAY FOR DRY ITCHY RASH    Dispense:  45 g    Refill:  3    Order Specific Question:   Supervising Provider    Answer:   Lyndon Code [[id number]]  . albuterol (VENTOLIN HFA) 108 (90 Base) MCG/ACT inhaler    Sig: Inhale 2 puffs into the lungs 3 (three) times daily.    Dispense:  18 g    Refill:  3    Please fill with alternative preferred by her insurance. Thanks.    Order Specific Question:   Supervising Provider    Answer:   Lyndon Code [[id number]]    Time spent: 63 Minutes      Dr Lyndon Code Internal medicine

## 2019-04-26 ENCOUNTER — Other Ambulatory Visit: Payer: Self-pay

## 2019-04-26 MED ORDER — LEVOTHYROXINE SODIUM 75 MCG PO TABS: ORAL_TABLET | ORAL | 1 refills | Status: DC

## 2019-04-28 ENCOUNTER — Other Ambulatory Visit: Payer: Self-pay

## 2019-04-28 DIAGNOSIS — L209 Atopic dermatitis, unspecified: Secondary | ICD-10-CM

## 2019-04-28 MED ORDER — BETAMETHASONE DIPROPIONATE AUG 0.05 % EX CREA: TOPICAL_CREAM | CUTANEOUS | 3 refills | Status: DC

## 2019-08-05 ENCOUNTER — Encounter: Payer: 59 | Admitting: Nurse Practitioner

## 2019-08-19 ENCOUNTER — Telehealth: Payer: Self-pay

## 2019-08-19 NOTE — Telephone Encounter (Signed)
Lmom for patient to screen and confirm for 08-23-19 ov.

## 2019-08-23 ENCOUNTER — Encounter: Payer: 59 | Admitting: Nurse Practitioner

## 2019-09-15 ENCOUNTER — Telehealth: Payer: Self-pay

## 2019-09-15 NOTE — Telephone Encounter (Signed)
Lmom to confirm and screen for 09-17-19 ov. 

## 2019-09-16 ENCOUNTER — Telehealth: Payer: Self-pay

## 2019-09-16 NOTE — Telephone Encounter (Signed)
Patient cancelled appointment on 09/17/2019 will call back to reschedule. klh

## 2019-09-17 ENCOUNTER — Encounter: Payer: Self-pay | Admitting: Nurse Practitioner

## 2019-10-20 ENCOUNTER — Other Ambulatory Visit: Payer: Self-pay

## 2019-10-20 MED ORDER — LEVOTHYROXINE SODIUM 75 MCG PO TABS: ORAL_TABLET | ORAL | 0 refills | Status: DC

## 2019-11-09 ENCOUNTER — Telehealth: Payer: Self-pay

## 2019-11-09 NOTE — Telephone Encounter (Signed)
CONFIRMED PATIENT APPT. -AR °

## 2019-11-11 ENCOUNTER — Encounter: Payer: Self-pay | Admitting: Nurse Practitioner

## 2019-11-11 ENCOUNTER — Telehealth: Payer: Self-pay

## 2019-11-11 NOTE — Telephone Encounter (Signed)
Patient cancelled appointment on 11/11/2019 will call back to reschedule. klh

## 2020-01-04 ENCOUNTER — Other Ambulatory Visit: Payer: Self-pay

## 2020-01-04 MED ORDER — LEVOTHYROXINE SODIUM 75 MCG PO TABS: ORAL_TABLET | ORAL | 0 refills | Status: DC

## 2020-01-04 NOTE — Telephone Encounter (Signed)
lmom pt need appt for refills send 10 days supply for levothyroxine

## 2020-01-07 ENCOUNTER — Other Ambulatory Visit: Payer: Self-pay

## 2020-01-07 MED ORDER — LEVOTHYROXINE SODIUM 75 MCG PO TABS: ORAL_TABLET | ORAL | 0 refills | Status: DC

## 2020-01-07 MED ORDER — CLOBETASOL PROPIONATE 0.05 % EX CREA: 1.0000 "application " | TOPICAL_CREAM | Freq: Two times a day (BID) | CUTANEOUS | 0 refills | Status: DC

## 2020-02-14 ENCOUNTER — Encounter: Payer: Self-pay | Admitting: Nurse Practitioner

## 2020-02-14 ENCOUNTER — Ambulatory Visit: Payer: Self-pay | Admitting: Nurse Practitioner

## 2020-02-14 ENCOUNTER — Other Ambulatory Visit: Payer: Self-pay

## 2020-02-14 VITALS — BP 130/88 | HR 94 | Temp 97.5°F | Resp 16 | Ht 59.0 in | Wt 161.0 lb

## 2020-02-14 DIAGNOSIS — M810 Age-related osteoporosis without current pathological fracture: Secondary | ICD-10-CM

## 2020-02-14 DIAGNOSIS — H6693 Otitis media, unspecified, bilateral: Secondary | ICD-10-CM

## 2020-02-14 DIAGNOSIS — Z0001 Encounter for general adult medical examination with abnormal findings: Secondary | ICD-10-CM

## 2020-02-14 DIAGNOSIS — L209 Atopic dermatitis, unspecified: Secondary | ICD-10-CM

## 2020-02-14 DIAGNOSIS — E039 Hypothyroidism, unspecified: Secondary | ICD-10-CM

## 2020-02-14 MED ORDER — AMOXICILLIN 400 MG/5ML PO SUSR: 800.0000 mg | Freq: Two times a day (BID) | ORAL | 0 refills | Status: DC

## 2020-02-14 MED ORDER — TRIAMCINOLONE ACETONIDE 0.025 % EX OINT: 1.0000 "application " | TOPICAL_OINTMENT | Freq: Two times a day (BID) | CUTANEOUS | 2 refills | Status: DC

## 2020-02-14 NOTE — Progress Notes (Signed)
[facility] [address] [place], [place] [address]  Internal MEDICINE  Office Visit Note  Patient Name: Erin Chan  [id number]  [id number]  Date of Service: 03/05/2020  Chief Complaint  Patient presents with  . Annual Exam  . Depression  . controlled substance form    reviewed with PT  . Quality Metric Gaps    flu,tetnaus, colonoscopy,hep C  . Ear Pain    sounds like water in left ear     The patient is here for health maintenance exam. She took an earl retirement from her job and is currently without insurance. She does have significant eczema/psoriasis. Unable to afford the augmented betamethasone cream. Would like to change something more affordable. States that the current prescription cost over $160.  Currently taking only the levothyroxine. Not taking sertraline. Does have some family stressors. Daughter-in-law is under treatment for pancreatic cancer. The patient's son has passed away a few years ago. The patient states this is her main mental struggle. She is helping to take care of her daughter-in-law and her grandson who is now 103 years old.  The patient is considering going back to work part time at company she retired from.    Pt is here for routine health maintenance examination  Current Medication: Outpatient Encounter Medications as of 02/14/2020  Medication Sig Note  . albuterol (VENTOLIN HFA) 108 (90 Base) MCG/ACT inhaler Inhale 2 puffs into the lungs 3 (three) times daily.   Marland Kitchen CALCIUM CITRATE-VITAMIN D PO Take by mouth daily.   Marland Kitchen levothyroxine (SYNTHROID) 75 MCG tablet TAKE 1 TABLET BY MOUTH ONCE A DAY BEFORE BREAKFAST   . [DISCONTINUED] augmented betamethasone dipropionate (DIPROLENE-AF) 0.05 % cream APPLY TO AFFECTED AREA TWICE A DAY FOR DRY ITCHY RASH 02/14/2020: too expensive   . [DISCONTINUED] clobetasol cream (TEMOVATE) 0.05 % Apply 1 application topically 2 (two) times daily.   . [DISCONTINUED] sertraline (ZOLOFT) 25 MG tablet TAKE 1  TABLET BY MOUTH EVERY DAY   . amoxicillin (AMOXIL) 400 MG/5ML suspension Take 10 mLs (800 mg total) by mouth 2 (two) times daily.   Marland Kitchen triamcinolone (KENALOG) 0.025 % ointment Apply 1 application topically 2 (two) times daily.    No facility-administered encounter medications on file as of 02/14/2020.    Surgical History: Past Surgical History:  Procedure Laterality Date  . KNEE SURGERY Right   . TUBAL LIGATION      Medical History: Past Medical History:  Diagnosis Date  . Allergy   . Depression   . Thyroid disease     Family History: Family History  Problem Relation Age of Onset  . Lung cancer Mother   . Diabetes Maternal Grandmother   . Heart attack Maternal Grandfather   . Breast cancer Neg Hx       Review of Systems  Constitutional: Negative for chills, fatigue and unexpected weight change.  HENT: Negative for congestion, postnasal drip, rhinorrhea, sneezing and sore throat.   Respiratory: Negative for cough, chest tightness and shortness of breath.   Cardiovascular: Negative for chest pain and palpitations.  Gastrointestinal: Negative for abdominal pain, constipation, diarrhea, nausea and vomiting.  Endocrine: Negative for cold intolerance, heat intolerance, polydipsia and polyuria.       History of hypothyroid. Declines labs at this time due to insurance   Genitourinary: Negative for dysuria, frequency and urgency.  Musculoskeletal: Negative for arthralgias, back pain, joint swelling and neck pain.  Skin: Negative for rash.       Intermittent atopic dermatitis.  Neurological: Negative for dizziness, tremors, numbness and headaches.  Hematological: Negative for adenopathy. Does not bruise/bleed easily.  Psychiatric/Behavioral: Positive for dysphoric mood. Negative for behavioral problems (Depression), sleep disturbance and suicidal ideas. The patient is nervous/anxious.      Today's Vitals   02/14/20 1538  BP: 130/88  Pulse: 94  Resp: 16  Temp: (!) 97.5 F  (36.4 C)  SpO2: 98%  Weight: 161 lb (73 kg)  Height: 4\' 11"  (1.499 m)   Body mass index is 32.52 kg/m.  Physical Exam Vitals and nursing note reviewed.  Constitutional:      General: She is not in acute distress.    Appearance: Normal appearance. She is well-developed. She is not diaphoretic.  HENT:     Head: Normocephalic and atraumatic.     Right Ear: There is mastoid tenderness. Tympanic membrane is erythematous and bulging.     Left Ear: There is mastoid tenderness. Tympanic membrane is erythematous and bulging.     Nose: Congestion present.     Mouth/Throat:     Pharynx: No oropharyngeal exudate.  Eyes:     Pupils: Pupils are equal, round, and reactive to light.  Neck:     Thyroid: No thyromegaly.     Vascular: No carotid bruit or JVD.     Trachea: No tracheal deviation.  Cardiovascular:     Rate and Rhythm: Normal rate and regular rhythm.     Pulses: Normal pulses.     Heart sounds: Normal heart sounds. No murmur heard.  No friction rub. No gallop.   Pulmonary:     Effort: Pulmonary effort is normal. No respiratory distress.     Breath sounds: Normal breath sounds. No wheezing or rales.  Chest:     Chest wall: No tenderness.  Abdominal:     General: Bowel sounds are normal.     Palpations: Abdomen is soft.     Tenderness: There is no abdominal tenderness.  Musculoskeletal:        General: Normal range of motion.     Cervical back: Normal range of motion and neck supple.  Lymphadenopathy:     Cervical: No cervical adenopathy.  Skin:    General: Skin is warm and dry.     Capillary Refill: Capillary refill takes less than 2 seconds.  Neurological:     General: No focal deficit present.     Mental Status: She is alert and oriented to person, place, and time.     Cranial Nerves: No cranial nerve deficit.  Psychiatric:        Attention and Perception: Attention and perception normal.        Mood and Affect: Mood is anxious and depressed.        Speech: Speech  normal.        Behavior: Behavior normal. Behavior is cooperative.        Thought Content: Thought content normal.        Cognition and Memory: Cognition and memory normal.        Judgment: Judgment normal.    Assessment/Plan: 1. Encounter for general adult medical examination with abnormal findings Annual health maintenance exam today  2. Atopic dermatitis, unspecified type May use triamcinolone 0.25% ointment twice daily as needed. - triamcinolone (KENALOG) 0.025 % ointment; Apply 1 application topically 2 (two) times daily.  Dispense: 30 g; Refill: 2  3. Acquired hypothyroidism Continue levothyroxine at current dose. Will check thyroid panel once insurance is active. Adjust dosing as indicated  4. Age-related  osteoporosis without current pathological fracture Continue calcium/vitamin d OTC.   5. Chronic otitis media of both ears Amoxicillin suspension twice daily for next 10 days.  - amoxicillin (AMOXIL) 400 MG/5ML suspension; Take 10 mLs (800 mg total) by mouth 2 (two) times daily.  Dispense: 200 mL; Refill: 0  General Counseling: Cydnee verbalizes understanding of the findings of todays visit and agrees with plan of treatment. I have discussed any further diagnostic evaluation that may be needed or ordered today. We also reviewed her medications today. she has been encouraged to call the office with any questions or concerns that should arise related to todays visit.    Counseling:   This patient was seen by Vincent Gros FNP Collaboration with Dr Lyndon Code as a part of collaborative care agreement  Meds ordered this encounter  Medications  . triamcinolone (KENALOG) 0.025 % ointment    Sig: Apply 1 application topically 2 (two) times daily.    Dispense:  30 g    Refill:  2    Order Specific Question:   Supervising Provider    Answer:   Lyndon Code [[id number]]  . amoxicillin (AMOXIL) 400 MG/5ML suspension    Sig: Take 10 mLs (800 mg total) by mouth 2 (two) times  daily.    Dispense:  200 mL    Refill:  0    Order Specific Question:   Supervising Provider    Answer:   Lyndon Code [[id number]]    Total time spent: 45 Minutes  Time spent includes review of chart, medications, test results, and follow up plan with the patient.     Lyndon Code, MD  Internal Medicine

## 2020-03-05 DIAGNOSIS — Z0001 Encounter for general adult medical examination with abnormal findings: Secondary | ICD-10-CM | POA: Insufficient documentation

## 2020-03-05 DIAGNOSIS — H6693 Otitis media, unspecified, bilateral: Secondary | ICD-10-CM | POA: Insufficient documentation

## 2020-04-03 ENCOUNTER — Other Ambulatory Visit: Payer: Self-pay

## 2020-04-03 MED ORDER — LEVOTHYROXINE SODIUM 75 MCG PO TABS: ORAL_TABLET | ORAL | 0 refills | Status: DC

## 2020-06-02 ENCOUNTER — Ambulatory Visit (INDEPENDENT_AMBULATORY_CARE_PROVIDER_SITE_OTHER): Payer: Self-pay | Admitting: Hospice and Palliative Medicine

## 2020-06-02 ENCOUNTER — Encounter: Payer: Self-pay | Admitting: Hospice and Palliative Medicine

## 2020-06-02 DIAGNOSIS — R059 Cough, unspecified: Secondary | ICD-10-CM

## 2020-06-02 DIAGNOSIS — Z1152 Encounter for screening for COVID-19: Secondary | ICD-10-CM

## 2020-06-02 DIAGNOSIS — U071 COVID-19: Secondary | ICD-10-CM

## 2020-06-02 LAB — POC SOFIA 2 FLU + SARS ANTIGEN FIA
Influenza A, POC: NEGATIVE
Influenza B, POC: NEGATIVE
SARS Coronavirus 2 Ag: POSITIVE — AB

## 2020-06-02 MED ORDER — PREDNISONE 10 MG PO TABS: ORAL_TABLET | ORAL | 0 refills | Status: DC

## 2020-06-02 MED ORDER — AZITHROMYCIN 250 MG PO TABS: ORAL_TABLET | ORAL | 0 refills | Status: DC

## 2020-06-02 MED ORDER — BENZONATATE 100 MG PO CAPS: 100.0000 mg | ORAL_CAPSULE | Freq: Two times a day (BID) | ORAL | 0 refills | Status: DC | PRN

## 2020-06-02 NOTE — Progress Notes (Signed)
[facility] [address] [place], [place] [address]  Internal MEDICINE  Telephone Visit  Patient Name: Erin Chan  [id number]  [id number]  Date of Service: 06/06/2020  I connected with the patient at 1052 by telephone and verified the patients identity using two identifiers.   I discussed the limitations, risks, security and privacy concerns of performing an evaluation and management service by telephone and the availability of in person appointments. I also discussed with the patient that there may be a patient responsible charge related to the service.  The patient expressed understanding and agrees to proceed.    Chief Complaint  Patient presents with  . Telephone Assessment    [phone] virtual  . Telephone Screen  . Cough    Clear mucus  . Headache  . Migraine    HPI Patient being seen virtually today for acute sick visit C/o headache, nasal congestion and coughing--symptoms started about 3 days ago with a migraine Denies fevers or body aches, no shortness of breath or difficulty breathing Not vaccinated against COVID19--she is concerned about contracting virus, has not yet been tested Has not been taking any OTC medications for symptom management Wanting to be tested for COVID   Current Medication: Outpatient Encounter Medications as of 06/02/2020  Medication Sig  . albuterol (VENTOLIN HFA) 108 (90 Base) MCG/ACT inhaler Inhale 2 puffs into the lungs 3 (three) times daily.  Marland Kitchen azithromycin (ZITHROMAX Z-PAK) 250 MG tablet Take two 250 mg tablets on day one followed by one 250 mg tablet each day for four days.  . benzonatate (TESSALON) 100 MG capsule Take 1 capsule (100 mg total) by mouth 2 (two) times daily as needed for cough.  Marland Kitchen CALCIUM CITRATE-VITAMIN D PO Take by mouth daily.  Marland Kitchen levothyroxine (SYNTHROID) 75 MCG tablet TAKE 1 TABLET BY MOUTH ONCE A DAY BEFORE BREAKFAST  . predniSONE (DELTASONE) 10 MG tablet Take 1 tablet three times a day with a meal  for three for three days, take 1 tablet by twice daily with a meal for 3 days, take 1 tablet once daily with a meal for 3 days  . triamcinolone (KENALOG) 0.025 % ointment Apply 1 application topically 2 (two) times daily.  . [DISCONTINUED] amoxicillin (AMOXIL) 400 MG/5ML suspension Take 10 mLs (800 mg total) by mouth 2 (two) times daily.   No facility-administered encounter medications on file as of 06/02/2020.    Surgical History: Past Surgical History:  Procedure Laterality Date  . KNEE SURGERY Right   . TUBAL LIGATION      Medical History: Past Medical History:  Diagnosis Date  . Allergy   . Depression   . Thyroid disease     Family History: Family History  Problem Relation Age of Onset  . Lung cancer Mother   . Diabetes Maternal Grandmother   . Heart attack Maternal Grandfather   . Breast cancer Neg Hx     Social History   Socioeconomic History  . Marital status: Divorced    Spouse name: Not on file  . Number of children: Not on file  . Years of education: Not on file  . Highest education level: Not on file  Occupational History  . Not on file  Tobacco Use  . Smoking status: Never Smoker  . Smokeless tobacco: Never Used  Substance and Sexual Activity  . Alcohol use: Never  . Drug use: Never  . Sexual activity: Not on file  Other Topics Concern  . Not on file  Social History  Narrative  . Not on file   Social Determinants of Health   Financial Resource Strain: Not on file  Food Insecurity: Not on file  Transportation Needs: Not on file  Physical Activity: Not on file  Stress: Not on file  Social Connections: Not on file  Intimate Partner Violence: Not on file      Review of Systems  Constitutional: Negative for chills, diaphoresis and fatigue.  HENT: Positive for congestion. Negative for ear pain, postnasal drip and sinus pressure.   Eyes: Negative for photophobia, discharge, redness, itching and visual disturbance.  Respiratory: Positive for  cough. Negative for shortness of breath and wheezing.   Cardiovascular: Negative for chest pain, palpitations and leg swelling.  Gastrointestinal: Negative for abdominal pain, constipation, diarrhea, nausea and vomiting.  Genitourinary: Negative for dysuria and flank pain.  Musculoskeletal: Negative for arthralgias, back pain, gait problem and neck pain.  Skin: Negative for color change.  Allergic/Immunologic: Negative for environmental allergies and food allergies.  Neurological: Positive for headaches. Negative for dizziness.  Hematological: Does not bruise/bleed easily.  Psychiatric/Behavioral: Negative for agitation, behavioral problems (depression) and hallucinations.    Vital Signs: Ht 4\' 11"  (1.499 m)   Wt 161 lb (73 kg)   BMI 32.52 kg/m    Observation/Objective: Alert and oriented, answers questions appropriately. No evidence of acute/respiraatory distress.  Assessment/Plan: 1. COVID-19 virus detected COVID19 positive Treat with ZPAK--advised to rest and increase fluid intake Treat fevers and body aches with acetaminophen as well as ibuprofen  - azithromycin (ZITHROMAX Z-PAK) 250 MG tablet; Take two 250 mg tablets on day one followed by one 250 mg tablet each day for four days.  Dispense: 6 each; Refill: 0 - POC SOFIA 2 FLU + SARS ANTIGEN FIA  2. Cough Treat cough with Tessalon as well as prednisone Consider CXR if symptoms have not improved or worsened within 10 days - benzonatate (TESSALON) 100 MG capsule; Take 1 capsule (100 mg total) by mouth 2 (two) times daily as needed for cough.  Dispense: 20 capsule; Refill: 0 - predniSONE (DELTASONE) 10 MG tablet; Take 1 tablet three times a day with a meal for three for three days, take 1 tablet by twice daily with a meal for 3 days, take 1 tablet once daily with a meal for 3 days  Dispense: 18 tablet; Refill: 0 - POC SOFIA 2 FLU + SARS ANTIGEN FIA  General Counseling: [name] verbalizes understanding of the findings of today's  phone visit and agrees with plan of treatment. I have discussed any further diagnostic evaluation that may be needed or ordered today. We also reviewed her medications today. she has been encouraged to call the office with any questions or concerns that should arise related to todays visit.    Orders Placed This Encounter  Procedures  . POC SOFIA 2 FLU + SARS ANTIGEN FIA    Meds ordered this encounter  Medications  . azithromycin (ZITHROMAX Z-PAK) 250 MG tablet    Sig: Take two 250 mg tablets on day one followed by one 250 mg tablet each day for four days.    Dispense:  6 each    Refill:  0  . benzonatate (TESSALON) 100 MG capsule    Sig: Take 1 capsule (100 mg total) by mouth 2 (two) times daily as needed for cough.    Dispense:  20 capsule    Refill:  0  . predniSONE (DELTASONE) 10 MG tablet    Sig: Take 1 tablet three times a day with  a meal for three for three days, take 1 tablet by twice daily with a meal for 3 days, take 1 tablet once daily with a meal for 3 days    Dispense:  18 tablet    Refill:  0     Time spent: 30 Minutes Time spent includes review of chart, medications, test results and follow-up plan with the patient.  Lubertha Basque Leilanny Fluitt AGNP-C Internal medicine

## 2020-06-06 ENCOUNTER — Encounter: Payer: Self-pay | Admitting: Hospice and Palliative Medicine

## 2020-07-06 ENCOUNTER — Other Ambulatory Visit: Payer: Self-pay

## 2020-07-06 ENCOUNTER — Other Ambulatory Visit: Payer: Self-pay | Admitting: Nurse Practitioner

## 2020-07-06 MED ORDER — LEVOTHYROXINE SODIUM 75 MCG PO TABS: ORAL_TABLET | ORAL | 0 refills | Status: DC

## 2020-07-26 IMAGING — MG DIGITAL SCREENING BILATERAL MAMMOGRAM WITH TOMO AND CAD
8 series · 8 of 24 positions shown · non-contrast
Comparison: Previous exam(s).

CLINICAL DATA: Screening.

EXAM:
DIGITAL SCREENING BILATERAL MAMMOGRAM WITH TOMO AND CAD

[R MLO synth-2D]
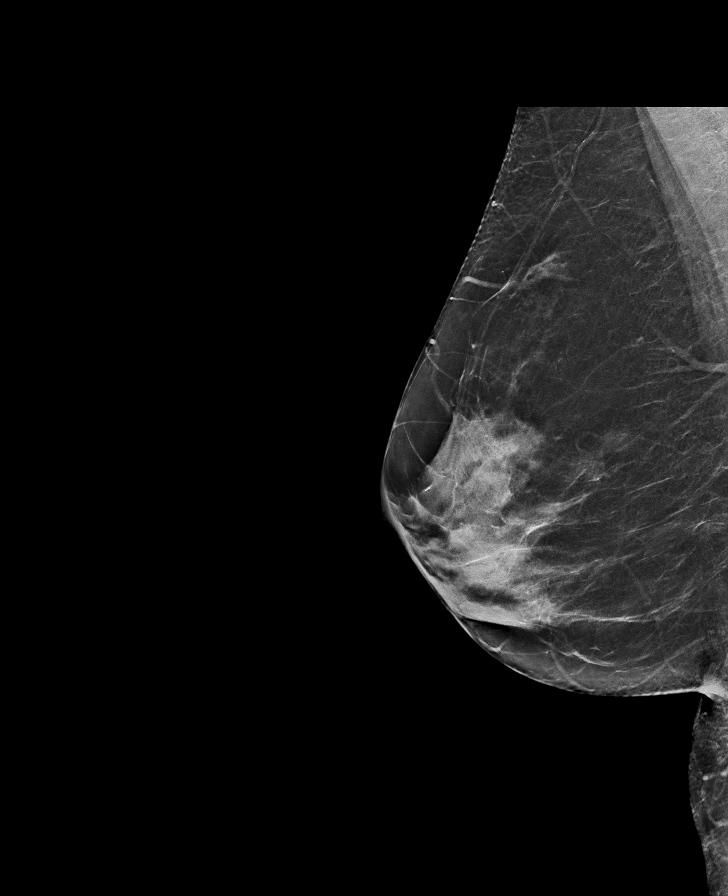

[R CC synth-2D]
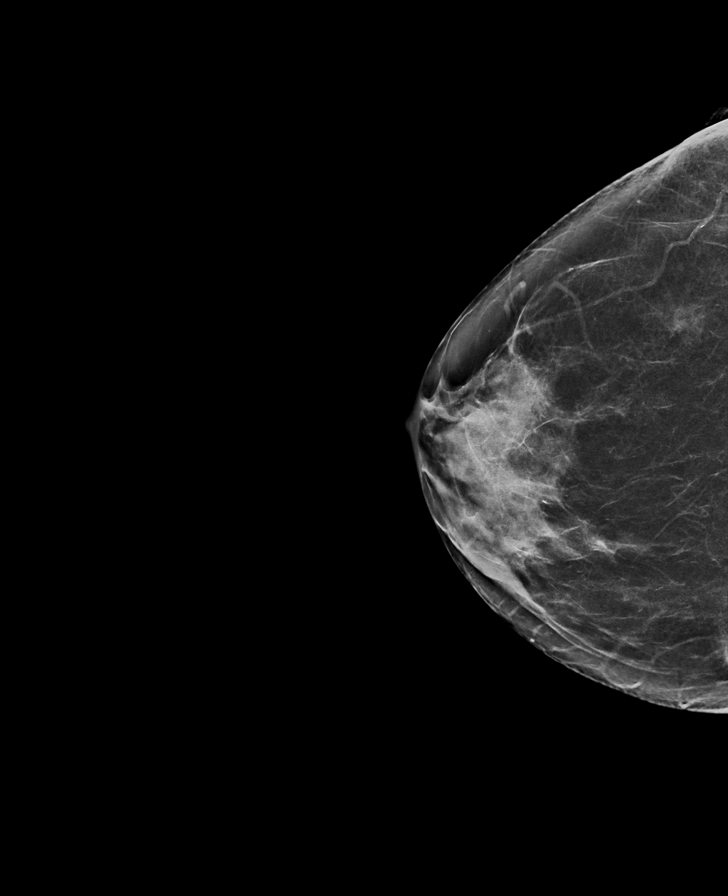

[L CC synth-2D]
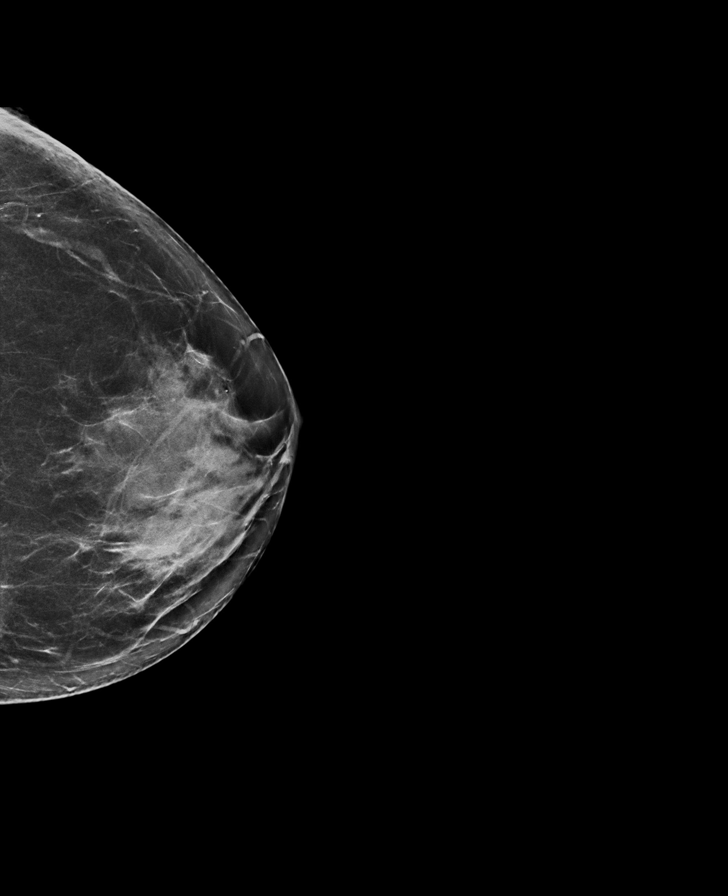

[L MLO synth-2D]
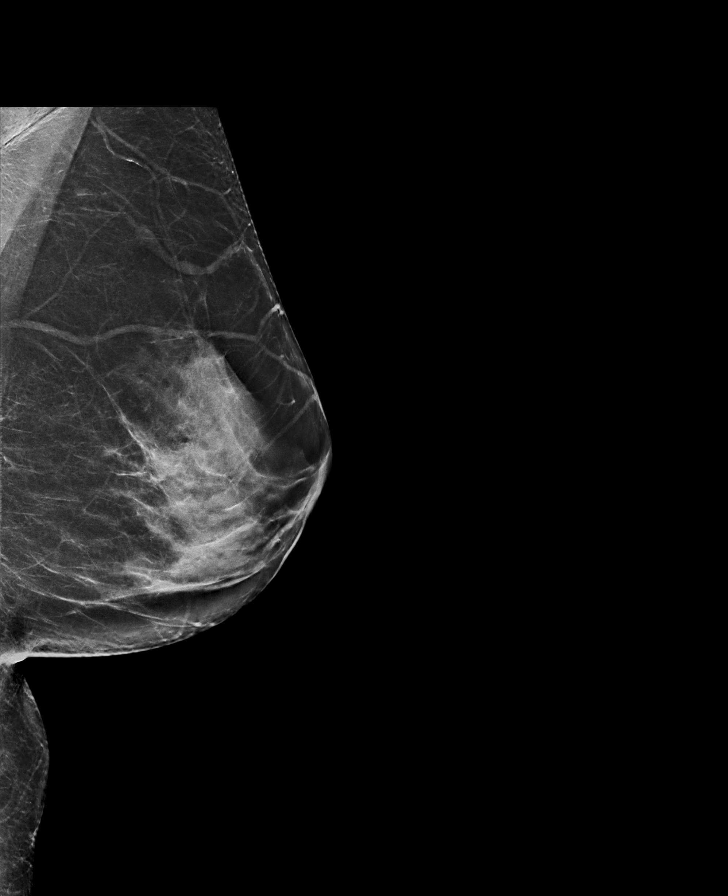

[R CC tomo · tomo slice 34/67.0]
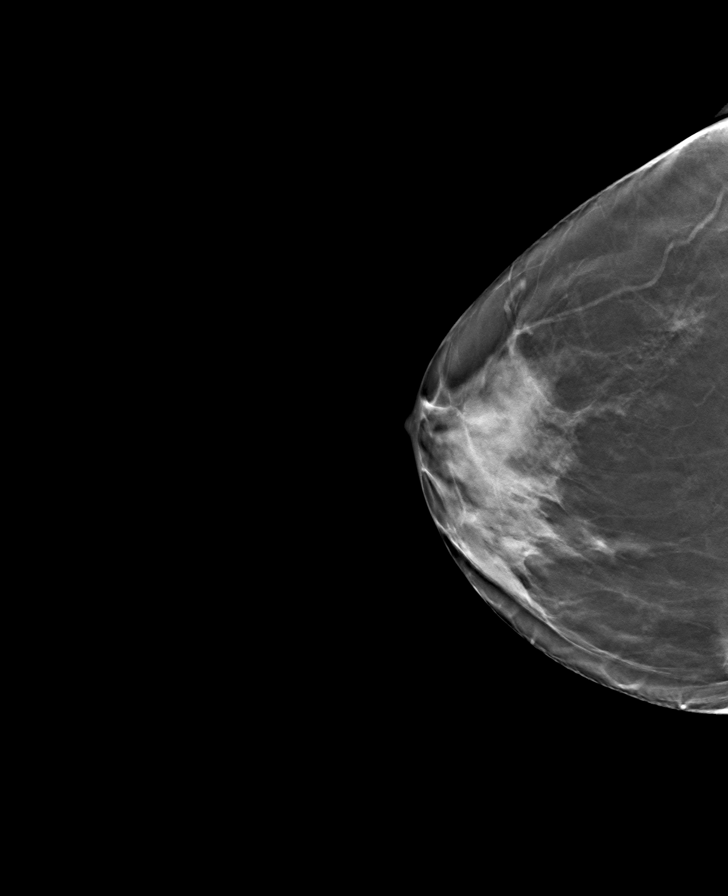

[L CC tomo · tomo slice 35/70.0]
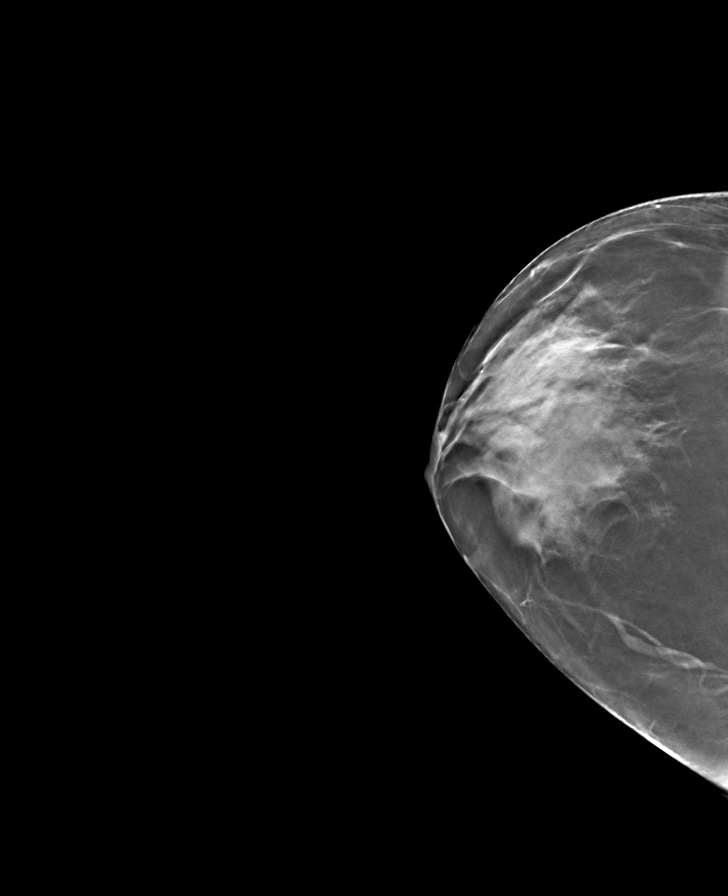

[L MLO tomo · tomo slice 37/72.0]
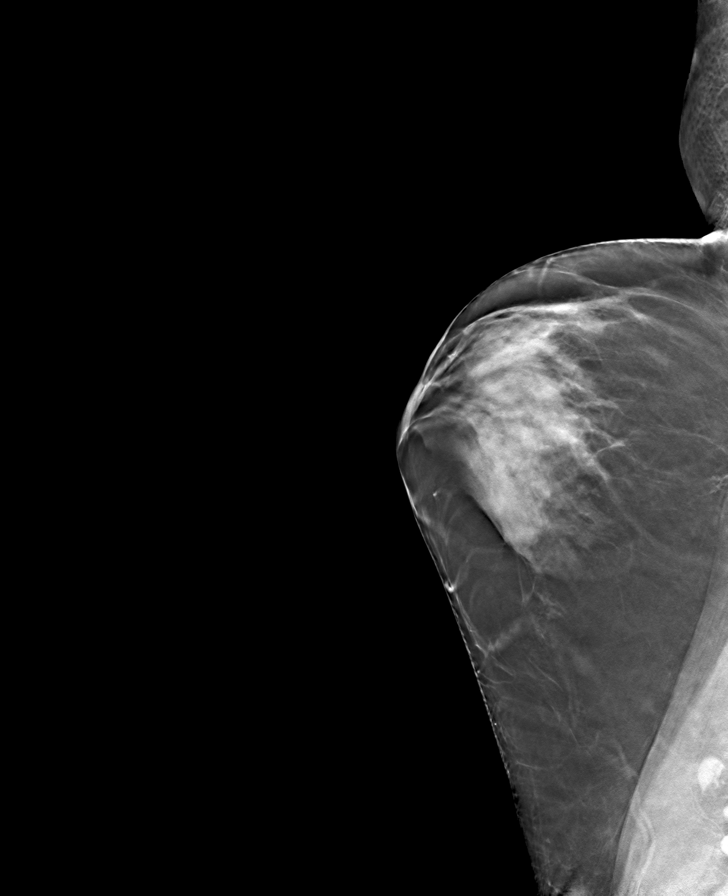

[R MLO tomo · tomo slice 34/67.0]
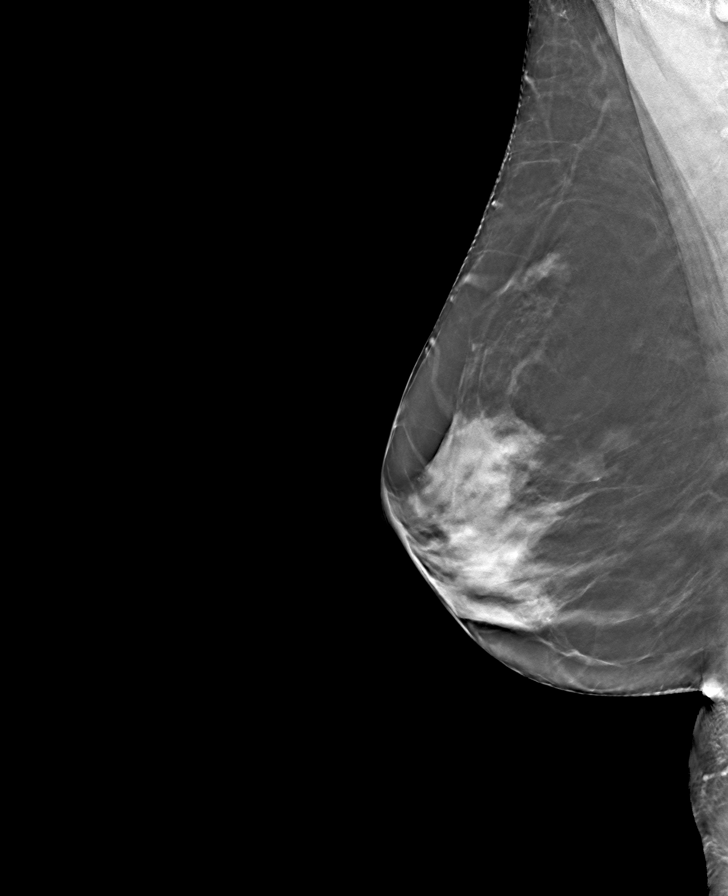

[8 of 24 positions shown; findings below may reference images not displayed]

ACR Breast Density Category c: The breast tissue is heterogeneously
dense, which may obscure small masses.
FINDINGS: There are no findings suspicious for malignancy. Images were
processed with CAD.
IMPRESSION: No mammographic evidence of malignancy. A result letter of this
screening mammogram will be mailed directly to the patient.

RECOMMENDATION:
Screening mammogram in one year. (Code:[id number]-U-LHB)

BI-RADS CATEGORY  1: Negative.

## 2020-08-12 ENCOUNTER — Other Ambulatory Visit: Payer: Self-pay | Admitting: Nurse Practitioner

## 2020-08-12 DIAGNOSIS — L209 Atopic dermatitis, unspecified: Secondary | ICD-10-CM

## 2020-08-14 ENCOUNTER — Ambulatory Visit: Payer: Self-pay | Admitting: Hospice and Palliative Medicine

## 2020-08-20 ENCOUNTER — Other Ambulatory Visit: Payer: Self-pay | Admitting: Nurse Practitioner

## 2020-08-20 DIAGNOSIS — L209 Atopic dermatitis, unspecified: Secondary | ICD-10-CM

## 2020-08-23 ENCOUNTER — Ambulatory Visit: Payer: Self-pay | Admitting: Hospice and Palliative Medicine

## 2020-10-03 ENCOUNTER — Other Ambulatory Visit: Payer: Self-pay | Admitting: Internal Medicine

## 2020-10-27 ENCOUNTER — Other Ambulatory Visit: Payer: Self-pay | Admitting: Internal Medicine

## 2020-11-18 ENCOUNTER — Other Ambulatory Visit: Payer: Self-pay | Admitting: Internal Medicine

## 2020-12-11 ENCOUNTER — Other Ambulatory Visit: Payer: Self-pay | Admitting: Internal Medicine

## 2020-12-11 NOTE — Telephone Encounter (Signed)
Appointment made

## 2020-12-22 ENCOUNTER — Telehealth: Payer: Self-pay

## 2020-12-22 NOTE — Telephone Encounter (Signed)
Patient called to request an afternoon appt, She has a rash going on that is swollen and red no itching is present. She states it has been going on for a couple days. I advised her that we had no available appts at this time. I advised the patient to go to a walk in clinic/urgent care. I did transfer her to the nurse line to get some nurse advice. The patient agreed she would go to a walk in clinic and talk to one of our nurses.

## 2021-03-09 ENCOUNTER — Other Ambulatory Visit: Payer: Self-pay | Admitting: Nurse Practitioner

## 2021-03-13 ENCOUNTER — Ambulatory Visit: Payer: Self-pay | Admitting: Nurse Practitioner

## 2021-03-13 ENCOUNTER — Other Ambulatory Visit: Payer: Self-pay

## 2021-03-13 ENCOUNTER — Encounter: Payer: Self-pay | Admitting: Nurse Practitioner

## 2021-03-13 VITALS — BP 136/87 | HR 76 | Temp 98.3°F | Resp 16 | Ht 59.5 in | Wt 158.2 lb

## 2021-03-13 DIAGNOSIS — Z1231 Encounter for screening mammogram for malignant neoplasm of breast: Secondary | ICD-10-CM

## 2021-03-13 DIAGNOSIS — L209 Atopic dermatitis, unspecified: Secondary | ICD-10-CM

## 2021-03-13 MED ORDER — CLOBETASOL PROPIONATE 0.05 % EX OINT: 1.0000 "application " | TOPICAL_OINTMENT | Freq: Two times a day (BID) | CUTANEOUS | 0 refills | Status: DC

## 2021-03-13 MED ORDER — HYDROXYZINE HCL 10 MG PO TABS: 10.0000 mg | ORAL_TABLET | Freq: Three times a day (TID) | ORAL | 0 refills | Status: DC | PRN

## 2021-03-13 NOTE — Progress Notes (Signed)
[facility] [address] [place], [place] [address]  Internal MEDICINE  Office Visit Note  Patient Name: Erin Chan  [id number]  [id number]  Date of Service: 03/13/2021  Chief Complaint  Patient presents with   Follow-up    Dry skin   Medication Refill    HPI Erin Chan presents for a follow up visit for medication refills and an itchy chronic rash she has had on her elbows and around her ankles bilaterally for most of her life. Rash for years, elbows and ankles bilaterally. She has been to a dermatologist years ago but no one has ever biopsied the area of the rash.    Current Medication: Outpatient Encounter Medications as of 03/13/2021  Medication Sig   CALCIUM CITRATE-VITAMIN D PO Take by mouth daily.   hydrOXYzine (ATARAX/VISTARIL) 10 MG tablet Take 1 tablet (10 mg total) by mouth 3 (three) times daily as needed.   levothyroxine (SYNTHROID) 75 MCG tablet TAKE 1 TABLET BY MOUTH EVERY DAY BEFORE BREAKFAST   [DISCONTINUED] clobetasol ointment (TEMOVATE) 0.05 % Apply 1 application topically 2 (two) times daily.   [DISCONTINUED] triamcinolone (KENALOG) 0.025 % ointment Apply 1 application topically 2 (two) times daily.   triamcinolone (KENALOG) 0.025 % ointment Apply 1 application topically 2 (two) times daily.   [DISCONTINUED] albuterol (VENTOLIN HFA) 108 (90 Base) MCG/ACT inhaler Inhale 2 puffs into the lungs 3 (three) times daily. (Patient not taking: Reported on 03/13/2021)   [DISCONTINUED] azithromycin (ZITHROMAX Z-PAK) 250 MG tablet Take two 250 mg tablets on day one followed by one 250 mg tablet each day for four days. (Patient not taking: Reported on 03/13/2021)   [DISCONTINUED] benzonatate (TESSALON) 100 MG capsule Take 1 capsule (100 mg total) by mouth 2 (two) times daily as needed for cough. (Patient not taking: Reported on 03/13/2021)   [DISCONTINUED] predniSONE (DELTASONE) 10 MG tablet Take 1 tablet three times a day with a meal for three for three days,  take 1 tablet by twice daily with a meal for 3 days, take 1 tablet once daily with a meal for 3 days (Patient not taking: Reported on 03/13/2021)   No facility-administered encounter medications on file as of 03/13/2021.    Surgical History: Past Surgical History:  Procedure Laterality Date   KNEE SURGERY Right    TUBAL LIGATION      Medical History: Past Medical History:  Diagnosis Date   Allergy    Depression    Thyroid disease     Family History: Family History  Problem Relation Age of Onset   Lung cancer Mother    Diabetes Maternal Grandmother    Heart attack Maternal Grandfather    Breast cancer Neg Hx     Social History   Socioeconomic History   Marital status: Divorced    Spouse name: Not on file   Number of children: Not on file   Years of education: Not on file   Highest education level: Not on file  Occupational History   Not on file  Tobacco Use   Smoking status: Never   Smokeless tobacco: Never  Substance and Sexual Activity   Alcohol use: Never   Drug use: Never   Sexual activity: Not on file  Other Topics Concern   Not on file  Social History Narrative   Not on file   Social Determinants of Health   Financial Resource Strain: Not on file  Food Insecurity: Not on file  Transportation Needs: Not on file  Physical Activity: Not on  file  Stress: Not on file  Social Connections: Not on file  Intimate Partner Violence: Not on file      Review of Systems  Constitutional:  Negative for chills, fatigue and unexpected weight change.  HENT:  Negative for congestion, rhinorrhea, sneezing and sore throat.   Eyes:  Negative for redness.  Respiratory:  Negative for cough, chest tightness and shortness of breath.   Cardiovascular:  Negative for chest pain and palpitations.  Gastrointestinal:  Negative for abdominal pain, constipation, diarrhea, nausea and vomiting.  Genitourinary:  Negative for dysuria and frequency.  Musculoskeletal:  Negative  for arthralgias, back pain, joint swelling and neck pain.  Skin:  Positive for rash (chronic rash to bilateral lower extremities and arms.).  Neurological: Negative.  Negative for tremors and numbness.  Hematological:  Negative for adenopathy. Does not bruise/bleed easily.  Psychiatric/Behavioral:  Negative for behavioral problems (Depression), sleep disturbance and suicidal ideas. The patient is not nervous/anxious.    Vital Signs: BP 136/87   Pulse 76   Temp 98.3 F (36.8 C)   Resp 16   Ht 4' 11.5" (1.511 m)   Wt 158 lb 3.2 oz (71.8 kg)   SpO2 98%   BMI 31.42 kg/m    Physical Exam Vitals reviewed.  Constitutional:      Appearance: Normal appearance.  HENT:     Head: Normocephalic and atraumatic.  Eyes:     Pupils: Pupils are equal, round, and reactive to light.  Cardiovascular:     Rate and Rhythm: Normal rate and regular rhythm.  Pulmonary:     Effort: Pulmonary effort is normal. No respiratory distress.  Skin:    Findings: Rash present. Rash is macular and scaling.     Comments: Scaling plaques on upper and lower extremities  Neurological:     Mental Status: She is alert and oriented to person, place, and time.       Assessment/Plan: 1. Atopic dermatitis, unspecified type Triamcinolone reordered, hydroxyzine prescribed to help with itching. Clobetasol prescribed to help clear up the rash. After the first of the new year in 2023, patient will have insurance and will order a dermatology referral specifically for a biopsy of the rash.  - hydrOXYzine (ATARAX/VISTARIL) 10 MG tablet; Take 1 tablet (10 mg total) by mouth 3 (three) times daily as needed.  Dispense: 30 tablet; Refill: 0 - triamcinolone (KENALOG) 0.025 % ointment; Apply 1 application topically 2 (two) times daily.  Dispense: 30 g; Refill: 2  2. Encounter for screening mammogram for malignant neoplasm of breast Mammogram ordered.  - MM 3D SCREEN BREAST BILATERAL; Future   General Counseling: Erin Chan  verbalizes understanding of the findings of todays visit and agrees with plan of treatment. I have discussed any further diagnostic evaluation that may be needed or ordered today. We also reviewed her medications today. she has been encouraged to call the office with any questions or concerns that should arise related to todays visit.    Orders Placed This Encounter  Procedures   MM 3D SCREEN BREAST BILATERAL    Meds ordered this encounter  Medications   DISCONTD: clobetasol ointment (TEMOVATE) 0.05 %    Sig: Apply 1 application topically 2 (two) times daily.    Dispense:  45 g    Refill:  0   hydrOXYzine (ATARAX/VISTARIL) 10 MG tablet    Sig: Take 1 tablet (10 mg total) by mouth 3 (three) times daily as needed.    Dispense:  30 tablet    Refill:  0   triamcinolone (KENALOG) 0.025 % ointment    Sig: Apply 1 application topically 2 (two) times daily.    Dispense:  30 g    Refill:  2    Return in about 4 weeks (around 04/10/2021) for F/U, eval new med, Signa Cheek PCP.   Total time spent: 20 Minutes Time spent includes review of chart, medications, test results, and follow up plan with the patient.   [place] Controlled Substance Database was reviewed by me.  This patient was seen by Sallyanne Kuster, FNP-C in collaboration with Dr. Beverely Risen as a part of collaborative care agreement.   Venda Dice R. Tedd Sias, MSN, FNP-C Internal medicine

## 2021-03-14 ENCOUNTER — Telehealth: Payer: Self-pay

## 2021-03-14 NOTE — Telephone Encounter (Signed)
Patient is requesting cream she is currently on to be sent to [facility]. She stated cream that was prescribed yesterday is too expensive-Toni

## 2021-03-15 ENCOUNTER — Encounter: Payer: Self-pay | Admitting: Nurse Practitioner

## 2021-03-19 MED ORDER — TRIAMCINOLONE ACETONIDE 0.025 % EX OINT: 1.0000 "application " | TOPICAL_OINTMENT | Freq: Two times a day (BID) | CUTANEOUS | 2 refills | Status: DC

## 2021-04-08 ENCOUNTER — Encounter: Payer: Self-pay | Admitting: Nurse Practitioner

## 2021-04-10 ENCOUNTER — Ambulatory Visit: Payer: Self-pay | Admitting: Nurse Practitioner

## 2021-04-10 DIAGNOSIS — Z0289 Encounter for other administrative examinations: Secondary | ICD-10-CM

## 2021-05-02 ENCOUNTER — Telehealth: Payer: Self-pay

## 2021-05-02 ENCOUNTER — Other Ambulatory Visit: Payer: Self-pay

## 2021-05-02 MED ORDER — LEVOTHYROXINE SODIUM 75 MCG PO TABS: ORAL_TABLET | ORAL | 0 refills | Status: DC

## 2021-05-02 NOTE — Telephone Encounter (Signed)
Pt called advising that she has appt on 05/18/21 for physical and she will run out of her thyroid medication, I looked at past appts and pt has cancelled and no showed several and I advised pt that I will send 30 days for medication but she will need to come for the appt or it could be a chance of her getting discharged from practice if she doesn't show up.  Pt advised she would be here for the appt

## 2021-05-15 ENCOUNTER — Telehealth: Payer: Self-pay

## 2021-05-15 NOTE — Telephone Encounter (Signed)
Left voicemail to confirm patient's appointment on 05/18/21.

## 2021-05-16 ENCOUNTER — Encounter: Payer: Self-pay | Admitting: Nurse Practitioner

## 2021-05-18 ENCOUNTER — Ambulatory Visit: Payer: Self-pay | Admitting: Nurse Practitioner

## 2021-05-18 ENCOUNTER — Other Ambulatory Visit: Payer: Self-pay

## 2021-05-18 ENCOUNTER — Encounter: Payer: Self-pay | Admitting: Nurse Practitioner

## 2021-05-18 VITALS — BP 120/70 | HR 88 | Temp 98.1°F | Resp 16 | Ht 59.0 in | Wt 159.0 lb

## 2021-05-18 DIAGNOSIS — E039 Hypothyroidism, unspecified: Secondary | ICD-10-CM

## 2021-05-18 DIAGNOSIS — M85859 Other specified disorders of bone density and structure, unspecified thigh: Secondary | ICD-10-CM

## 2021-05-18 DIAGNOSIS — L209 Atopic dermatitis, unspecified: Secondary | ICD-10-CM

## 2021-05-18 DIAGNOSIS — Z0001 Encounter for general adult medical examination with abnormal findings: Secondary | ICD-10-CM

## 2021-05-18 DIAGNOSIS — J4520 Mild intermittent asthma, uncomplicated: Secondary | ICD-10-CM

## 2021-05-18 DIAGNOSIS — R300 Dysuria: Secondary | ICD-10-CM

## 2021-05-18 MED ORDER — HYDROXYZINE HCL 10 MG PO TABS: 10.0000 mg | ORAL_TABLET | Freq: Three times a day (TID) | ORAL | 0 refills | Status: DC | PRN

## 2021-05-18 MED ORDER — LEVOTHYROXINE SODIUM 75 MCG PO TABS: ORAL_TABLET | ORAL | 1 refills | Status: DC

## 2021-05-18 NOTE — Progress Notes (Deleted)
[facility] [place], [place] [address]  Internal MEDICINE  Office Visit Note  Patient Name: Erin Chan  [id number]  [id number]  Date of Service: 05/18/2021  Chief Complaint  Patient presents with   Annual Exam   Depression    Depression        Associated symptoms include no fatigue, no appetite change, no myalgias, no headaches and no suicidal ideas. Florastine presents for a follow-up visit for  Depression previously due to sone passing away. Doing ok now. Osteopenia 2020, did treatment.  120/70 Hold off on preventive screenings until gets medicare next year.   Current Medication: Outpatient Encounter Medications as of 05/18/2021  Medication Sig   CALCIUM CITRATE-VITAMIN D PO Take by mouth daily.   triamcinolone (KENALOG) 0.025 % ointment Apply 1 application topically 2 (two) times daily.   [DISCONTINUED] hydrOXYzine (ATARAX/VISTARIL) 10 MG tablet Take 1 tablet (10 mg total) by mouth 3 (three) times daily as needed.   [DISCONTINUED] levothyroxine (SYNTHROID) 75 MCG tablet TAKE 1 TABLET BY MOUTH EVERY DAY BEFORE BREAKFAST   hydrOXYzine (ATARAX) 10 MG tablet Take 1 tablet (10 mg total) by mouth 3 (three) times daily as needed.   levothyroxine (SYNTHROID) 75 MCG tablet TAKE 1 TABLET BY MOUTH EVERY DAY BEFORE BREAKFAST   No facility-administered encounter medications on file as of 05/18/2021.    Surgical History: Past Surgical History:  Procedure Laterality Date   KNEE SURGERY Right    TUBAL LIGATION      Medical History: Past Medical History:  Diagnosis Date   Allergy    Depression    Thyroid disease     Family History: Family History  Problem Relation Age of Onset   Lung cancer Mother    Diabetes Maternal Grandmother    Heart attack Maternal Grandfather    Breast cancer Neg Hx     Social History   Socioeconomic History   Marital status: Divorced    Spouse name: Not on file   Number of children: Not on file   Years of  education: Not on file   Highest education level: Not on file  Occupational History   Not on file  Tobacco Use   Smoking status: Never   Smokeless tobacco: Never  Substance and Sexual Activity   Alcohol use: Never   Drug use: Never   Sexual activity: Not on file  Other Topics Concern   Not on file  Social History Narrative   Not on file   Social Determinants of Health   Financial Resource Strain: Not on file  Food Insecurity: Not on file  Transportation Needs: Not on file  Physical Activity: Not on file  Stress: Not on file  Social Connections: Not on file  Intimate Partner Violence: Not on file      Review of Systems  Constitutional:  Negative for activity change, appetite change, chills, fatigue, fever and unexpected weight change.  HENT: Negative.  Negative for congestion, ear pain, rhinorrhea, sore throat and trouble swallowing.   Eyes: Negative.   Respiratory: Negative.  Negative for cough, chest tightness, shortness of breath and wheezing.   Cardiovascular: Negative.  Negative for chest pain.  Gastrointestinal: Negative.  Negative for abdominal pain, blood in stool, constipation, diarrhea, nausea and vomiting.  Endocrine: Negative.   Genitourinary: Negative.  Negative for difficulty urinating, dysuria, frequency, hematuria and urgency.  Musculoskeletal: Negative.  Negative for arthralgias, back pain, joint swelling, myalgias and neck pain.  Skin: Negative.  Negative for rash and  wound.  Allergic/Immunologic: Negative.  Negative for immunocompromised state.  Neurological: Negative.  Negative for dizziness, seizures, numbness and headaches.  Hematological: Negative.   Psychiatric/Behavioral:  Positive for depression. Negative for behavioral problems, self-injury and suicidal ideas. The patient is not nervous/anxious.    Vital Signs: BP (!) 136/100    Pulse 88    Temp 98.1 F (36.7 C)    Resp 16    Ht 4\' 11"  (1.499 m)    Wt 159 lb (72.1 kg)    SpO2 97%    BMI 32.11  kg/m    Physical Exam Vitals reviewed.  Constitutional:      General: She is not in acute distress.    Appearance: She is well-developed. She is not diaphoretic.  HENT:     Head: Normocephalic and atraumatic.     Right Ear: Tympanic membrane, ear canal and external ear normal.     Left Ear: Tympanic membrane, ear canal and external ear normal.     Nose: Nose normal. No congestion or rhinorrhea.     Mouth/Throat:     Mouth: Mucous membranes are moist.     Pharynx: Oropharynx is clear. No oropharyngeal exudate or posterior oropharyngeal erythema.  Eyes:     General: No scleral icterus.       Right eye: No discharge.        Left eye: No discharge.     Extraocular Movements: Extraocular movements intact.     Conjunctiva/sclera: Conjunctivae normal.     Pupils: Pupils are equal, round, and reactive to light.  Neck:     Thyroid: No thyromegaly.     Vascular: No JVD.     Trachea: No tracheal deviation.  Cardiovascular:     Rate and Rhythm: Normal rate and regular rhythm.     Heart sounds: Normal heart sounds. No murmur heard.   No friction rub. No gallop.  Pulmonary:     Effort: Pulmonary effort is normal. No respiratory distress.     Breath sounds: Normal breath sounds. No stridor. No wheezing or rales.  Chest:     Chest wall: No tenderness.  Abdominal:     General: Bowel sounds are normal. There is no distension.     Palpations: Abdomen is soft. There is no mass.     Tenderness: There is no abdominal tenderness. There is no guarding or rebound.  Musculoskeletal:        General: No tenderness or deformity. Normal range of motion.     Cervical back: Normal range of motion and neck supple.  Lymphadenopathy:     Cervical: No cervical adenopathy.  Skin:    General: Skin is warm and dry.     Coloration: Skin is not pale.     Findings: No erythema or rash.  Neurological:     Mental Status: She is alert and oriented to person, place, and time.     Cranial Nerves: No cranial  nerve deficit.     Motor: No abnormal muscle tone.     Coordination: Coordination normal.     Gait: Gait normal.     Deep Tendon Reflexes: Reflexes are normal and symmetric.  Psychiatric:        Mood and Affect: Mood normal.        Behavior: Behavior normal.        Thought Content: Thought content normal.        Judgment: Judgment normal.       Assessment/Plan:   General Counseling: Andrika verbalizes understanding  of the findings of todays visit and agrees with plan of treatment. I have discussed any further diagnostic evaluation that may be needed or ordered today. We also reviewed her medications today. she has been encouraged to call the office with any questions or concerns that should arise related to todays visit.    No orders of the defined types were placed in this encounter.   Meds ordered this encounter  Medications   levothyroxine (SYNTHROID) 75 MCG tablet    Sig: TAKE 1 TABLET BY MOUTH EVERY DAY BEFORE BREAKFAST    Dispense:  90 tablet    Refill:  1   hydrOXYzine (ATARAX) 10 MG tablet    Sig: Take 1 tablet (10 mg total) by mouth 3 (three) times daily as needed.    Dispense:  30 tablet    Refill:  0    Return in about 6 months (around 11/15/2021) for F/U, med refill, Jilliann Subramanian PCP.   Total time spent:30 Minutes Time spent includes review of chart, medications, test results, and follow up plan with the patient.   [place] Controlled Substance Database was reviewed by me.  This patient was seen by Jonetta Osgood, FNP-C in collaboration with Dr. Clayborn Bigness as a part of collaborative care agreement.   Kathy Wahid R. Valetta Fuller, MSN, FNP-C Internal medicine

## 2021-05-25 ENCOUNTER — Encounter: Payer: Self-pay | Admitting: Nurse Practitioner

## 2021-05-25 NOTE — Progress Notes (Signed)
[facility] [place], [place] [address]  Internal MEDICINE  Office Visit Note  Patient Name: Erin Chan  [id number]  [id number]  Date of Service: 05/18/2021  Chief Complaint  Patient presents with   Annual Exam   Depression    HPI Iler presents for an annual well visit and physical exam. She is a well appearing 66 yo female. She does not have any insurance right now. She is not eligible for medicare until next year. She did have depression previously due to son passing away. She reports that she is doing ok now. She had osteopenia in 2020 and did take the treatment previously. She is overdue for preventive screenings such as mammogram and colonoscopy. Will wait until next year when patient has insurance. She is well overdue for labs and is being treated for hypothyroidism and no recent labs for more than 2 years. Will provide lab slip.  Her skin is improving but still possible that she has psoriasis. Will send patient to dermatologist as previously discussed once she has insurance next year to get a biopsy of the rash to identify it.     Current Medication: Outpatient Encounter Medications as of 05/18/2021  Medication Sig   CALCIUM CITRATE-VITAMIN D PO Take by mouth daily.   triamcinolone (KENALOG) 0.025 % ointment Apply 1 application topically 2 (two) times daily.   [DISCONTINUED] hydrOXYzine (ATARAX/VISTARIL) 10 MG tablet Take 1 tablet (10 mg total) by mouth 3 (three) times daily as needed.   [DISCONTINUED] levothyroxine (SYNTHROID) 75 MCG tablet TAKE 1 TABLET BY MOUTH EVERY DAY BEFORE BREAKFAST   hydrOXYzine (ATARAX) 10 MG tablet Take 1 tablet (10 mg total) by mouth 3 (three) times daily as needed.   levothyroxine (SYNTHROID) 75 MCG tablet TAKE 1 TABLET BY MOUTH EVERY DAY BEFORE BREAKFAST   No facility-administered encounter medications on file as of 05/18/2021.    Surgical History: Past Surgical History:  Procedure Laterality Date   KNEE  SURGERY Right    TUBAL LIGATION      Medical History: Past Medical History:  Diagnosis Date   Allergy    Depression    Thyroid disease     Family History: Family History  Problem Relation Age of Onset   Lung cancer Mother    Diabetes Maternal Grandmother    Heart attack Maternal Grandfather    Breast cancer Neg Hx     Social History   Socioeconomic History   Marital status: Divorced    Spouse name: Not on file   Number of children: Not on file   Years of education: Not on file   Highest education level: Not on file  Occupational History   Not on file  Tobacco Use   Smoking status: Never   Smokeless tobacco: Never  Substance and Sexual Activity   Alcohol use: Never   Drug use: Never   Sexual activity: Not on file  Other Topics Concern   Not on file  Social History Narrative   Not on file   Social Determinants of Health   Financial Resource Strain: Not on file  Food Insecurity: Not on file  Transportation Needs: Not on file  Physical Activity: Not on file  Stress: Not on file  Social Connections: Not on file  Intimate Partner Violence: Not on file      Review of Systems  Constitutional:  Negative for activity change, appetite change, chills, fatigue, fever and unexpected weight change.  HENT: Negative.  Negative for congestion, ear pain,  rhinorrhea, sore throat and trouble swallowing.   Eyes: Negative.   Respiratory: Negative.  Negative for cough, chest tightness, shortness of breath and wheezing.   Cardiovascular: Negative.  Negative for chest pain.  Gastrointestinal: Negative.  Negative for abdominal pain, blood in stool, constipation, diarrhea, nausea and vomiting.  Endocrine: Negative.   Genitourinary: Negative.  Negative for difficulty urinating, dysuria, frequency, hematuria and urgency.  Musculoskeletal: Negative.  Negative for arthralgias, back pain, joint swelling, myalgias and neck pain.  Skin:  Positive for rash. Negative for wound.   Allergic/Immunologic: Negative.  Negative for immunocompromised state.  Neurological: Negative.  Negative for dizziness, seizures, numbness and headaches.  Hematological: Negative.   Psychiatric/Behavioral:  Negative for behavioral problems, self-injury and suicidal ideas. The patient is not nervous/anxious.    Vital Signs: BP 120/70    Pulse 88    Temp 98.1 F (36.7 C)    Resp 16    Ht 4\' 11"  (1.499 m)    Wt 159 lb (72.1 kg)    SpO2 97%    BMI 32.11 kg/m    Physical Exam Vitals reviewed.  Constitutional:      General: She is awake. She is not in acute distress.    Appearance: Normal appearance. She is well-developed, well-groomed and normal weight. She is not ill-appearing or diaphoretic.  HENT:     Head: Normocephalic and atraumatic.     Right Ear: Tympanic membrane, ear canal and external ear normal.     Left Ear: Tympanic membrane, ear canal and external ear normal.     Nose: No congestion or rhinorrhea.     Mouth/Throat:     Lips: Pink.     Mouth: Mucous membranes are moist.     Pharynx: Oropharynx is clear. Uvula midline. No oropharyngeal exudate or posterior oropharyngeal erythema.  Eyes:     General: Lids are normal. Vision grossly intact. Gaze aligned appropriately. No scleral icterus.       Right eye: No discharge.        Left eye: No discharge.     Extraocular Movements: Extraocular movements intact.     Conjunctiva/sclera: Conjunctivae normal.     Pupils: Pupils are equal, round, and reactive to light.     Funduscopic exam:    Right eye: Red reflex present.        Left eye: Red reflex present. Neck:     Thyroid: No thyromegaly.     Vascular: No JVD.     Trachea: Trachea and phonation normal. No tracheal deviation.  Cardiovascular:     Rate and Rhythm: Normal rate and regular rhythm.     Pulses: Normal pulses.     Heart sounds: Normal heart sounds, S1 normal and S2 normal. No murmur heard.   No friction rub. No gallop.  Pulmonary:     Effort: Pulmonary effort  is normal. No accessory muscle usage or respiratory distress.     Breath sounds: Normal breath sounds and air entry. No stridor. No wheezing or rales.  Chest:     Chest wall: No tenderness.     Comments: Declined clinical breast exam.  Abdominal:     General: Bowel sounds are normal. There is no distension.     Palpations: Abdomen is soft. There is no shifting dullness, fluid wave, mass or pulsatile mass.     Tenderness: There is no abdominal tenderness. There is no guarding or rebound.  Musculoskeletal:        General: No tenderness or deformity. Normal range of  motion.     Cervical back: Normal range of motion and neck supple.     Right lower leg: No edema.     Left lower leg: No edema.  Lymphadenopathy:     Cervical: No cervical adenopathy.  Skin:    General: Skin is warm and dry.     Capillary Refill: Capillary refill takes less than 2 seconds.     Coloration: Skin is not pale.     Findings: Rash present. No erythema. Rash is macular and scaling.     Comments: Scaling plaques on upper and lower extremities  Neurological:     Mental Status: She is alert and oriented to person, place, and time.     Cranial Nerves: No cranial nerve deficit.     Motor: No abnormal muscle tone.     Coordination: Coordination normal.     Gait: Gait normal.     Deep Tendon Reflexes: Reflexes are normal and symmetric.  Psychiatric:        Mood and Affect: Mood normal.        Behavior: Behavior normal. Behavior is cooperative.        Thought Content: Thought content normal.        Judgment: Judgment normal.       Assessment/Plan: 1. Encounter for general adult medical examination with abnormal findings Age-appropriate preventive screenings and vaccinations discussed, annual physical exam completed. Routine labs for health maintenance ordered on lab order for and given to patient. PHM updated. Hold off on preventive screening until she gets medicare next year hopefully.   2. Acquired  hypothyroidism Will get labs, refills ordered - levothyroxine (SYNTHROID) 75 MCG tablet; TAKE 1 TABLET BY MOUTH EVERY DAY BEFORE BREAKFAST  Dispense: 90 tablet; Refill: 1  3. Atopic dermatitis, unspecified type Improving, continue using topical steroid as needed and hydroxyzine refill ordered - hydrOXYzine (ATARAX) 10 MG tablet; Take 1 tablet (10 mg total) by mouth 3 (three) times daily as needed.  Dispense: 30 tablet; Refill: 0  4. Mild intermittent asthma without complication Stable, no changes  5. Osteopenia of neck of femur, unspecified laterality Previously treated, will check bone density scan next year      General Counseling: Camauri verbalizes understanding of the findings of todays visit and agrees with plan of treatment. I have discussed any further diagnostic evaluation that may be needed or ordered today. We also reviewed her medications today. she has been encouraged to call the office with any questions or concerns that should arise related to todays visit.    No orders of the defined types were placed in this encounter.   Meds ordered this encounter  Medications   levothyroxine (SYNTHROID) 75 MCG tablet    Sig: TAKE 1 TABLET BY MOUTH EVERY DAY BEFORE BREAKFAST    Dispense:  90 tablet    Refill:  1   hydrOXYzine (ATARAX) 10 MG tablet    Sig: Take 1 tablet (10 mg total) by mouth 3 (three) times daily as needed.    Dispense:  30 tablet    Refill:  0    Return in about 6 months (around 11/15/2021) for F/U, med refill, Azrielle Springsteen PCP.   Total time spent:30 Minutes Time spent includes review of chart, medications, test results, and follow up plan with the patient.   [place] Controlled Substance Database was reviewed by me.  This patient was seen by Jonetta Osgood, FNP-C in collaboration with Dr. Clayborn Bigness as a part of collaborative care agreement.  Sandrika Schwinn R. Felina Tello,  MSN, FNP-C Internal medicine

## 2021-11-07 ENCOUNTER — Ambulatory Visit (INDEPENDENT_AMBULATORY_CARE_PROVIDER_SITE_OTHER): Payer: Self-pay | Admitting: Nurse Practitioner

## 2021-11-07 ENCOUNTER — Encounter: Payer: Self-pay | Admitting: Nurse Practitioner

## 2021-11-07 VITALS — BP 120/78 | HR 83 | Temp 97.6°F | Resp 16 | Ht 59.0 in | Wt 161.2 lb

## 2021-11-07 DIAGNOSIS — M85852 Other specified disorders of bone density and structure, left thigh: Secondary | ICD-10-CM

## 2021-11-07 DIAGNOSIS — E039 Hypothyroidism, unspecified: Secondary | ICD-10-CM

## 2021-11-07 DIAGNOSIS — L209 Atopic dermatitis, unspecified: Secondary | ICD-10-CM

## 2021-11-07 MED ORDER — LEVOTHYROXINE SODIUM 75 MCG PO TABS: ORAL_TABLET | ORAL | 1 refills | Status: DC

## 2021-11-07 MED ORDER — TRIAMCINOLONE ACETONIDE 0.025 % EX OINT: 1.0000 | TOPICAL_OINTMENT | Freq: Two times a day (BID) | CUTANEOUS | 6 refills | Status: DC

## 2021-11-07 NOTE — Progress Notes (Signed)
[facility] [address] [place], [place] [address]  Internal MEDICINE  Office Visit Note  Patient Name: Erin Chan  [id number]  [id number]  Date of Service: 11/07/2021  Chief Complaint  Patient presents with   Follow-up   Depression   Medication Refill    Triamcinolone Cream    HPI Erin Chan presents for a follow up visit for atopic dermatitis, hypothyroidism and medication refills . Need to get labs from January - [facility] Need Refills. Went to eye doc, has macular degeneration Brother passed away recently, she is still grieving but coping.  Will have medicare next year.    Current Medication: Outpatient Encounter Medications as of 11/07/2021  Medication Sig   CALCIUM CITRATE-VITAMIN D PO Take by mouth daily.   hydrOXYzine (ATARAX) 10 MG tablet Take 1 tablet (10 mg total) by mouth 3 (three) times daily as needed.   [DISCONTINUED] levothyroxine (SYNTHROID) 75 MCG tablet TAKE 1 TABLET BY MOUTH EVERY DAY BEFORE BREAKFAST   [DISCONTINUED] triamcinolone (KENALOG) 0.025 % ointment Apply 1 application topically 2 (two) times daily.   levothyroxine (SYNTHROID) 75 MCG tablet TAKE 1 TABLET BY MOUTH EVERY DAY BEFORE BREAKFAST   triamcinolone (KENALOG) 0.025 % ointment Apply 1 Application topically 2 (two) times daily.   No facility-administered encounter medications on file as of 11/07/2021.    Surgical History: Past Surgical History:  Procedure Laterality Date   KNEE SURGERY Right    TUBAL LIGATION      Medical History: Past Medical History:  Diagnosis Date   Allergy    Depression    Thyroid disease     Family History: Family History  Problem Relation Age of Onset   Lung cancer Mother    Diabetes Maternal Grandmother    Heart attack Maternal Grandfather    Breast cancer Neg Hx     Social History   Socioeconomic History   Marital status: Divorced    Spouse name: Not on file   Number of children: Not on file   Years of education:  Not on file   Highest education level: Not on file  Occupational History   Not on file  Tobacco Use   Smoking status: Never   Smokeless tobacco: Never  Substance and Sexual Activity   Alcohol use: Never   Drug use: Never   Sexual activity: Not on file  Other Topics Concern   Not on file  Social History Narrative   Not on file   Social Determinants of Health   Financial Resource Strain: Not on file  Food Insecurity: Not on file  Transportation Needs: Not on file  Physical Activity: Not on file  Stress: Not on file  Social Connections: Not on file  Intimate Partner Violence: Not on file      Review of Systems  Constitutional:  Negative for chills, fatigue and unexpected weight change.  HENT:  Negative for congestion, rhinorrhea, sneezing and sore throat.   Eyes:  Negative for redness.  Respiratory:  Negative for cough, chest tightness and shortness of breath.   Cardiovascular:  Negative for chest pain and palpitations.  Gastrointestinal:  Negative for abdominal pain, constipation, diarrhea, nausea and vomiting.  Genitourinary:  Negative for dysuria and frequency.  Musculoskeletal:  Negative for arthralgias, back pain, joint swelling and neck pain.  Skin:  Negative for rash.  Neurological: Negative.  Negative for tremors and numbness.  Hematological:  Negative for adenopathy. Does not bruise/bleed easily.  Psychiatric/Behavioral:  Positive for depression. Negative for behavioral problems (  Depression), sleep disturbance and suicidal ideas. The patient is not nervous/anxious.     Vital Signs: BP 120/78   Pulse 83   Temp 97.6 F (36.4 C)   Resp 16   Ht 4\' 11"  (1.499 m)   Wt 161 lb 3.2 oz (73.1 kg)   SpO2 96%   BMI 32.56 kg/m    Physical Exam Vitals reviewed.  Constitutional:      Appearance: Normal appearance. She is obese.  HENT:     Head: Normocephalic and atraumatic.  Eyes:     Pupils: Pupils are equal, round, and reactive to light.  Cardiovascular:      Rate and Rhythm: Normal rate and regular rhythm.  Pulmonary:     Effort: Pulmonary effort is normal. No respiratory distress.  Skin:    General: Skin is warm and dry.  Neurological:     Mental Status: She is alert.  Psychiatric:        Mood and Affect: Mood normal.        Behavior: Behavior normal.        Assessment/Plan: 1. Acquired hypothyroidism Refills ordered, stable.  - levothyroxine (SYNTHROID) 75 MCG tablet; TAKE 1 TABLET BY MOUTH EVERY DAY BEFORE BREAKFAST  Dispense: 90 tablet; Refill: 1  2. Atopic dermatitis, unspecified type Stable, refills ordered.  - triamcinolone (KENALOG) 0.025 % ointment; Apply 1 Application topically 2 (two) times daily.  Dispense: 30 g; Refill: 6  3. Osteopenia of neck of left femur Taking calcium and vitamin D supplement.    General Counseling: Erin Chan verbalizes understanding of the findings of todays visit and agrees with plan of treatment. I have discussed any further diagnostic evaluation that may be needed or ordered today. We also reviewed her medications today. she has been encouraged to call the office with any questions or concerns that should arise related to todays visit.    No orders of the defined types were placed in this encounter.   Meds ordered this encounter  Medications   levothyroxine (SYNTHROID) 75 MCG tablet    Sig: TAKE 1 TABLET BY MOUTH EVERY DAY BEFORE BREAKFAST    Dispense:  90 tablet    Refill:  1    For future refills   triamcinolone (KENALOG) 0.025 % ointment    Sig: Apply 1 Application topically 2 (two) times daily.    Dispense:  30 g    Refill:  6    Return for previously scheduled, CPE, Erin Chan PCP in january.   Total time spent:30 Minutes Time spent includes review of chart, medications, test results, and follow up plan with the patient.   [place] Controlled Substance Database was reviewed by me.  This patient was seen by February, FNP-C in collaboration with Dr. Sallyanne Kuster as a part of  collaborative care agreement.   Erin Chan R. Erin Risen, MSN, FNP-C Internal medicine

## 2021-11-14 ENCOUNTER — Ambulatory Visit: Payer: Self-pay | Admitting: Nurse Practitioner

## 2021-11-26 ENCOUNTER — Telehealth: Payer: Self-pay

## 2021-11-26 ENCOUNTER — Ambulatory Visit: Payer: Self-pay | Admitting: Physician Assistant

## 2021-11-26 ENCOUNTER — Encounter: Payer: Self-pay | Admitting: Physician Assistant

## 2021-11-26 ENCOUNTER — Other Ambulatory Visit: Payer: Self-pay

## 2021-11-26 VITALS — BP 137/73 | HR 90 | Temp 97.8°F | Resp 16 | Ht 59.0 in | Wt 161.0 lb

## 2021-11-26 DIAGNOSIS — L989 Disorder of the skin and subcutaneous tissue, unspecified: Secondary | ICD-10-CM

## 2021-11-26 MED ORDER — ERGOCALCIFEROL 1.25 MG (50000 UT) PO CAPS: 50000.0000 [IU] | ORAL_CAPSULE | ORAL | 5 refills | Status: DC

## 2021-11-26 NOTE — Progress Notes (Unsigned)
[facility] [address] [place], [place] [address]  Internal MEDICINE  Office Visit Note  Patient Name: Erin Chan  [id number]  [id number]  Date of Service: 11/27/2021  Chief Complaint  Patient presents with   Foreign Body in Nose    Bump on nose - first noticed Friday of last week     HPI Pt is here for a sick visit. -Bump along nose, doesn't hurt. A little itchy and more so aggravating. Hits where glasses are. Bump started Friday. -Came in because a little close to eye -Did a salt water compress and neosporin. Pharmacists told her to try regular warm compresses. -Bump seems to be getting better and will continue current regimen and avoid placing glasses directly on the spot if possible to avoid further irritation. -She does report that she was told she doesn't have macular degeneration but has some changes and was told to take vitmains by eye doctor. -Is still waiting to hear about lab results and will call lab to obtain these and then will call her with results  Current Medication:  Outpatient Encounter Medications as of 11/26/2021  Medication Sig   CALCIUM CITRATE-VITAMIN D PO Take by mouth daily.   hydrOXYzine (ATARAX) 10 MG tablet Take 1 tablet (10 mg total) by mouth 3 (three) times daily as needed.   levothyroxine (SYNTHROID) 75 MCG tablet TAKE 1 TABLET BY MOUTH EVERY DAY BEFORE BREAKFAST   triamcinolone (KENALOG) 0.025 % ointment Apply 1 Application topically 2 (two) times daily.   [DISCONTINUED] ergocalciferol (VITAMIN D2) 1.25 MG (50000 UT) capsule Take 50,000 Units by mouth once a week.   No facility-administered encounter medications on file as of 11/26/2021.      Medical History: Past Medical History:  Diagnosis Date   Allergy    Depression    Thyroid disease      Vital Signs: BP 137/73   Pulse 90   Temp 97.8 F (36.6 C)   Resp 16   Ht 4\' 11"  (1.499 m)   Wt 161 lb (73 kg)   SpO2 99%   BMI 32.52 kg/m    Review of Systems   Constitutional:  Negative for fatigue and fever.  HENT:  Negative for congestion, mouth sores and postnasal drip.   Eyes:  Negative for pain.  Respiratory:  Negative for cough.   Cardiovascular:  Negative for chest pain.  Genitourinary:  Negative for flank pain.  Skin:        Bump on nose  Psychiatric/Behavioral: Negative.      Physical Exam Vitals and nursing note reviewed.  Constitutional:      Appearance: Normal appearance. She is obese.  HENT:     Head: Normocephalic and atraumatic.  Eyes:     Conjunctiva/sclera: Conjunctivae normal.     Pupils: Pupils are equal, round, and reactive to light.  Cardiovascular:     Rate and Rhythm: Normal rate and regular rhythm.  Pulmonary:     Effort: Pulmonary effort is normal. No respiratory distress.  Skin:    General: Skin is warm and dry.     Findings: Lesion present.     Comments: Small raised red lesion along right side of nose that appears to be resolving  Neurological:     Mental Status: She is alert.  Psychiatric:        Mood and Affect: Mood normal.        Behavior: Behavior normal.       Assessment/Plan: 1. Skin lesion of face Small  area of irritation along nose that appears to be resolving already. Avoid pressure on this area with glasses when able and may continue compresses and keeping area clean.   General Counseling: Tanette verbalizes understanding of the findings of todays visit and agrees with plan of treatment. I have discussed any further diagnostic evaluation that may be needed or ordered today. We also reviewed her medications today. she has been encouraged to call the office with any questions or concerns that should arise related to todays visit.    Counseling:    No orders of the defined types were placed in this encounter.   No orders of the defined types were placed in this encounter.   Time spent:25 Minutes

## 2021-11-26 NOTE — Telephone Encounter (Signed)
Pt advised as per lauren that her vitamin d is low she can take drisdol once a week and her chol is elevated follow diet and exercise

## 2021-11-27 ENCOUNTER — Ambulatory Visit: Payer: Self-pay | Admitting: Nurse Practitioner

## 2022-02-11 ENCOUNTER — Other Ambulatory Visit: Payer: Self-pay | Admitting: Nurse Practitioner

## 2022-02-11 DIAGNOSIS — E039 Hypothyroidism, unspecified: Secondary | ICD-10-CM

## 2022-05-10 ENCOUNTER — Other Ambulatory Visit: Payer: Self-pay | Admitting: Physician Assistant

## 2022-05-10 ENCOUNTER — Encounter: Payer: Medicare Other | Admitting: Nurse Practitioner

## 2022-05-24 ENCOUNTER — Ambulatory Visit (INDEPENDENT_AMBULATORY_CARE_PROVIDER_SITE_OTHER): Payer: Medicare Other | Admitting: Nurse Practitioner

## 2022-05-24 ENCOUNTER — Encounter: Payer: Self-pay | Admitting: Nurse Practitioner

## 2022-05-24 VITALS — BP 115/82 | HR 85 | Temp 98.2°F | Resp 16 | Ht 59.0 in | Wt 169.0 lb

## 2022-05-24 DIAGNOSIS — E782 Mixed hyperlipidemia: Secondary | ICD-10-CM

## 2022-05-24 DIAGNOSIS — Z1211 Encounter for screening for malignant neoplasm of colon: Secondary | ICD-10-CM | POA: Diagnosis not present

## 2022-05-24 DIAGNOSIS — L209 Atopic dermatitis, unspecified: Secondary | ICD-10-CM

## 2022-05-24 DIAGNOSIS — R300 Dysuria: Secondary | ICD-10-CM

## 2022-05-24 DIAGNOSIS — Z0001 Encounter for general adult medical examination with abnormal findings: Secondary | ICD-10-CM

## 2022-05-24 DIAGNOSIS — E039 Hypothyroidism, unspecified: Secondary | ICD-10-CM | POA: Diagnosis not present

## 2022-05-24 DIAGNOSIS — Z1212 Encounter for screening for malignant neoplasm of rectum: Secondary | ICD-10-CM | POA: Diagnosis not present

## 2022-05-24 DIAGNOSIS — E559 Vitamin D deficiency, unspecified: Secondary | ICD-10-CM | POA: Diagnosis not present

## 2022-05-24 DIAGNOSIS — Z1231 Encounter for screening mammogram for malignant neoplasm of breast: Secondary | ICD-10-CM | POA: Diagnosis not present

## 2022-05-24 MED ORDER — HYDROXYZINE HCL 10 MG PO TABS: 10.0000 mg | ORAL_TABLET | Freq: Three times a day (TID) | ORAL | 0 refills | Status: DC | PRN

## 2022-05-24 MED ORDER — TRIAMCINOLONE ACETONIDE 0.025 % EX OINT: 1.0000 | TOPICAL_OINTMENT | Freq: Two times a day (BID) | CUTANEOUS | 6 refills | Status: DC

## 2022-05-24 NOTE — Progress Notes (Unsigned)
[facility] [place], [place] [address]  Internal MEDICINE  Office Visit Note  Patient Name: Erin Chan  [id number]  [id number]  Date of Service: 05/24/2022  Chief Complaint  Patient presents with   Annual Exam   Depression    HPI Erin Chan presents for an annual well visit and physical exam.  Well-appearing 67 y.o. female/female with [PMH]  Routine CRC screening: Routine mammogram: DEXA scan: Pap smear: Eye exam and/or foot exam: Labs:  New or worsening pain: Other concerns:    Functional Status Survey: Is the patient deaf or have difficulty hearing?: No Does the patient have difficulty seeing, even when wearing glasses/contacts?: No Does the patient have difficulty concentrating, remembering, or making decisions?: No Does the patient have difficulty walking or climbing stairs?: No Does the patient have difficulty dressing or bathing?: No Does the patient have difficulty doing errands alone such as visiting a doctor's office or shopping?: No     02/14/2020    3:43 PM 03/13/2021   10:59 AM 05/18/2021   11:01 AM 11/07/2021    9:41 AM 05/24/2022   10:01 AM  [place] in the past year? 0 0 1 1 0  Was there an injury with Fall?   0 0 0  Fall Risk Category Calculator   1 1 0  Fall Risk Category (Retired)   Low Low   (RETIRED) Patient Fall Risk Level  Low fall risk Low fall risk    Patient at Risk for Falls Due to  No Fall Risks   No Fall Risks  Fall risk Follow up  Falls evaluation completed Falls evaluation completed  Falls evaluation completed       05/24/2022   10:01 AM  Depression screen PHQ 2/9  Decreased Interest 0  Down, Depressed, Hopeless 0  PHQ - 2 Score 0       Current Medication: Outpatient Encounter Medications as of 05/24/2022  Medication Sig   CALCIUM CITRATE-VITAMIN D PO Take by mouth daily.   ergocalciferol (VITAMIN D2) 1.25 MG (50000 UT) capsule Take 1 capsule (50,000 Units total) by mouth once a week.    levothyroxine (SYNTHROID) 75 MCG tablet TAKE 1 TABLET BY MOUTH EVERY DAY BEFORE BREAKFAST   [DISCONTINUED] hydrOXYzine (ATARAX) 10 MG tablet Take 1 tablet (10 mg total) by mouth 3 (three) times daily as needed.   [DISCONTINUED] triamcinolone (KENALOG) 0.025 % ointment Apply 1 Application topically 2 (two) times daily.   hydrOXYzine (ATARAX) 10 MG tablet Take 1 tablet (10 mg total) by mouth 3 (three) times daily as needed.   triamcinolone (KENALOG) 0.025 % ointment Apply 1 Application topically 2 (two) times daily.   No facility-administered encounter medications on file as of 05/24/2022.    Surgical History: Past Surgical History:  Procedure Laterality Date   KNEE SURGERY Right    TUBAL LIGATION      Medical History: Past Medical History:  Diagnosis Date   Allergy    Depression    Thyroid disease     Family History: Family History  Problem Relation Age of Onset   Lung cancer Mother    Diabetes Maternal Grandmother    Heart attack Maternal Grandfather    Breast cancer Neg Hx     Social History   Socioeconomic History   Marital status: Divorced    Spouse name: Not on file   Number of children: Not on file   Years of education: Not on file   Highest education level: Not  on file  Occupational History   Not on file  Tobacco Use   Smoking status: Never   Smokeless tobacco: Never  Substance and Sexual Activity   Alcohol use: Never   Drug use: Never   Sexual activity: Not on file  Other Topics Concern   Not on file  Social History Narrative   Not on file   Social Determinants of Health   Financial Resource Strain: Not on file  Food Insecurity: Not on file  Transportation Needs: Not on file  Physical Activity: Not on file  Stress: Not on file  Social Connections: Not on file  Intimate Partner Violence: Not on file      Review of Systems  Vital Signs: BP 115/82   Pulse 85   Temp 98.2 F (36.8 C)   Resp 16   Ht 4\' 11"  (1.499 m)   Wt 169 lb (76.7 kg)    SpO2 96%   BMI 34.13 kg/m    Physical Exam     Assessment/Plan:     General Counseling: Erin Chan verbalizes understanding of the findings of todays visit and agrees with plan of treatment. I have discussed any further diagnostic evaluation that may be needed or ordered today. We also reviewed her medications today. she has been encouraged to call the office with any questions or concerns that should arise related to todays visit.    Orders Placed This Encounter  Procedures   MM 3D SCREEN BREAST BILATERAL   CBC with Differential/Platelet   CMP14+EGFR   Lipid Profile   Vitamin D (25 hydroxy)   TSH + free T4   Cologuard    Meds ordered this encounter  Medications   triamcinolone (KENALOG) 0.025 % ointment    Sig: Apply 1 Application topically 2 (two) times daily.    Dispense:  30 g    Refill:  6   hydrOXYzine (ATARAX) 10 MG tablet    Sig: Take 1 tablet (10 mg total) by mouth 3 (three) times daily as needed.    Dispense:  30 tablet    Refill:  0    Return in about 6 months (around 11/22/2022) for F/U, Erin Chan PCP.   Total time spent:30 Minutes Time spent includes review of chart, medications, test results, and follow up plan with the patient.   [place] Controlled Substance Database was reviewed by me.  This patient was seen by Erin Osgood, FNP-C in collaboration with Dr. Clayborn Chan as a part of collaborative care agreement.  Erin Louvier R. Valetta Fuller, MSN, FNP-C Internal medicine

## 2022-05-25 ENCOUNTER — Encounter: Payer: Self-pay | Admitting: Nurse Practitioner

## 2022-05-25 LAB — MICROSCOPIC EXAMINATION
Bacteria, UA: NONE SEEN
Casts: NONE SEEN /lpf
Epithelial Cells (non renal): NONE SEEN /hpf (ref 0–10)
RBC, Urine: NONE SEEN /hpf (ref 0–2)
WBC, UA: NONE SEEN /hpf (ref 0–5)

## 2022-05-25 LAB — UA/M W/RFLX CULTURE, ROUTINE
Bilirubin, UA: NEGATIVE
Glucose, UA: NEGATIVE
Ketones, UA: NEGATIVE
Leukocytes,UA: NEGATIVE
Nitrite, UA: NEGATIVE
Protein,UA: NEGATIVE
RBC, UA: NEGATIVE
Specific Gravity, UA: 1.01 (ref 1.005–1.030)
Urobilinogen, Ur: 0.2 mg/dL (ref 0.2–1.0)
pH, UA: 6.5 (ref 5.0–7.5)

## 2022-06-03 DIAGNOSIS — Z0001 Encounter for general adult medical examination with abnormal findings: Secondary | ICD-10-CM | POA: Diagnosis not present

## 2022-06-03 DIAGNOSIS — E559 Vitamin D deficiency, unspecified: Secondary | ICD-10-CM | POA: Diagnosis not present

## 2022-06-03 DIAGNOSIS — E782 Mixed hyperlipidemia: Secondary | ICD-10-CM | POA: Diagnosis not present

## 2022-06-03 DIAGNOSIS — E039 Hypothyroidism, unspecified: Secondary | ICD-10-CM | POA: Diagnosis not present

## 2022-06-04 LAB — TSH+FREE T4
Free T4: 1.22 ng/dL (ref 0.82–1.77)
TSH: 3.03 u[IU]/mL (ref 0.450–4.500)

## 2022-06-04 LAB — CMP14+EGFR
ALT: 15 IU/L (ref 0–32)
AST: 18 IU/L (ref 0–40)
Albumin/Globulin Ratio: 1.7 (ref 1.2–2.2)
Albumin: 4.5 g/dL (ref 3.9–4.9)
Alkaline Phosphatase: 96 IU/L (ref 44–121)
BUN/Creatinine Ratio: 10 — ABNORMAL LOW (ref 12–28)
BUN: 8 mg/dL (ref 8–27)
Bilirubin Total: 0.2 mg/dL (ref 0.0–1.2)
CO2: 22 mmol/L (ref 20–29)
Calcium: 9.4 mg/dL (ref 8.7–10.3)
Chloride: 105 mmol/L (ref 96–106)
Creatinine, Ser: 0.77 mg/dL (ref 0.57–1.00)
Globulin, Total: 2.7 g/dL (ref 1.5–4.5)
Glucose: 95 mg/dL (ref 70–99)
Potassium: 4.3 mmol/L (ref 3.5–5.2)
Sodium: 143 mmol/L (ref 134–144)
Total Protein: 7.2 g/dL (ref 6.0–8.5)
eGFR: 86 mL/min/{1.73_m2} (ref >59)

## 2022-06-04 LAB — CBC WITH DIFFERENTIAL/PLATELET
Basophils Absolute: 0 10*3/uL (ref 0.0–0.2)
Basos: 1 %
EOS (ABSOLUTE): 0.4 10*3/uL (ref 0.0–0.4)
Eos: 7 %
Hematocrit: 39.6 % (ref 34.0–46.6)
Hemoglobin: 12.9 g/dL (ref 11.1–15.9)
Immature Grans (Abs): 0 10*3/uL (ref 0.0–0.1)
Immature Granulocytes: 0 %
Lymphocytes Absolute: 1.6 10*3/uL (ref 0.7–3.1)
Lymphs: 28 %
MCH: 30.9 pg (ref 26.6–33.0)
MCHC: 32.6 g/dL (ref 31.5–35.7)
MCV: 95 fL (ref 79–97)
Monocytes Absolute: 0.3 10*3/uL (ref 0.1–0.9)
Monocytes: 5 %
Neutrophils Absolute: 3.4 10*3/uL (ref 1.4–7.0)
Neutrophils: 59 %
Platelets: 372 10*3/uL (ref 150–450)
RBC: 4.17 x10E6/uL (ref 3.77–5.28)
RDW: 12.8 % (ref 11.7–15.4)
WBC: 5.7 10*3/uL (ref 3.4–10.8)

## 2022-06-04 LAB — LIPID PANEL
Chol/HDL Ratio: 4.6 ratio — ABNORMAL HIGH (ref 0.0–4.4)
Cholesterol, Total: 196 mg/dL (ref 100–199)
HDL: 43 mg/dL (ref >39)
LDL Chol Calc (NIH): 124 mg/dL — ABNORMAL HIGH (ref 0–99)
Triglycerides: 161 mg/dL — ABNORMAL HIGH (ref 0–149)
VLDL Cholesterol Cal: 29 mg/dL (ref 5–40)

## 2022-06-04 LAB — VITAMIN D 25 HYDROXY (VIT D DEFICIENCY, FRACTURES): Vit D, 25-Hydroxy: 25.1 ng/mL — ABNORMAL LOW (ref 30.0–100.0)

## 2022-06-12 ENCOUNTER — Telehealth: Payer: Self-pay

## 2022-06-12 ENCOUNTER — Ambulatory Visit
Admission: RE | Admit: 2022-06-12 | Discharge: 2022-06-12 | Disposition: A | Discharge status: Home / Self Care | Payer: Medicare Other | Source: Ambulatory Visit | Attending: Nurse Practitioner | Admitting: Nurse Practitioner

## 2022-06-12 DIAGNOSIS — Z1231 Encounter for screening mammogram for malignant neoplasm of breast: Secondary | ICD-10-CM | POA: Insufficient documentation

## 2022-06-16 DIAGNOSIS — Z1211 Encounter for screening for malignant neoplasm of colon: Secondary | ICD-10-CM | POA: Diagnosis not present

## 2022-06-16 DIAGNOSIS — Z1212 Encounter for screening for malignant neoplasm of rectum: Secondary | ICD-10-CM | POA: Diagnosis not present

## 2022-06-20 ENCOUNTER — Telehealth: Payer: Self-pay

## 2022-06-20 NOTE — Progress Notes (Signed)
All labs are normal except elevated cholesterol levels. It would be beneficial to start on a low dose cholesterol medication. Also limiting red meat, eat lean proteins (chicken, [place], fish) and take OTC fish oil or flaxseed oil supplement. If she is willing to start medication, let me know I will order it.

## 2022-06-20 NOTE — Telephone Encounter (Signed)
Left message for patient to give office a call back.

## 2022-06-26 ENCOUNTER — Other Ambulatory Visit: Payer: Self-pay | Admitting: Nurse Practitioner

## 2022-06-26 LAB — COLOGUARD: COLOGUARD: NEGATIVE

## 2022-06-26 MED ORDER — ROSUVASTATIN CALCIUM 5 MG PO TABS: 5.0000 mg | ORAL_TABLET | Freq: Every day | ORAL | 1 refills | Status: DC

## 2022-06-26 NOTE — Telephone Encounter (Signed)
Pt spoke with kelly

## 2022-06-27 ENCOUNTER — Telehealth: Payer: Self-pay

## 2022-06-27 NOTE — Telephone Encounter (Signed)
Patient notified

## 2022-06-27 NOTE — Telephone Encounter (Signed)
-----   Message from Jonetta Osgood, NP sent at 06/27/2022  8:02 AM EST ----- Cologuard is negative, repeat in 3 years.

## 2022-06-27 NOTE — Progress Notes (Signed)
Cologuard is negative, repeat in 3 years.

## 2022-06-27 NOTE — Telephone Encounter (Signed)
Left message for patient to give office a call back.

## 2022-08-09 ENCOUNTER — Other Ambulatory Visit: Payer: Self-pay | Admitting: Nurse Practitioner

## 2022-08-09 DIAGNOSIS — E039 Hypothyroidism, unspecified: Secondary | ICD-10-CM

## 2022-11-25 ENCOUNTER — Ambulatory Visit: Payer: Medicare Other | Admitting: Nurse Practitioner

## 2022-11-25 ENCOUNTER — Encounter: Payer: Self-pay | Admitting: Nurse Practitioner

## 2022-11-25 VITALS — BP 115/75 | HR 75 | Temp 98.2°F | Resp 16 | Ht 59.0 in | Wt 159.6 lb

## 2022-11-25 DIAGNOSIS — M810 Age-related osteoporosis without current pathological fracture: Secondary | ICD-10-CM | POA: Diagnosis not present

## 2022-11-25 DIAGNOSIS — E039 Hypothyroidism, unspecified: Secondary | ICD-10-CM

## 2022-11-25 DIAGNOSIS — E782 Mixed hyperlipidemia: Secondary | ICD-10-CM

## 2022-11-25 DIAGNOSIS — R030 Elevated blood-pressure reading, without diagnosis of hypertension: Secondary | ICD-10-CM

## 2022-11-25 DIAGNOSIS — E559 Vitamin D deficiency, unspecified: Secondary | ICD-10-CM

## 2022-11-25 MED ORDER — LEVOTHYROXINE SODIUM 75 MCG PO TABS
ORAL_TABLET | ORAL | 1 refills | Status: DC
Start: 2022-11-25 — End: 2023-05-30

## 2022-11-25 MED ORDER — ROSUVASTATIN CALCIUM 5 MG PO TABS
5.0000 mg | ORAL_TABLET | Freq: Every day | ORAL | 1 refills | Status: DC
Start: 2022-11-25 — End: 2023-05-30

## 2022-11-25 NOTE — Progress Notes (Signed)
[facility] [address] [place], [place] [address]  Internal MEDICINE  Office Visit Note  Patient Name: Erin Chan  [id number]  [id number]  Date of Service: 11/25/2022  Chief Complaint  Patient presents with   Follow-up   Depression    HPI Dvora presents for a follow-up visit for high cholesterol, low vitamin D and hypothyroidism.  Osteopenia/osteoporosis -- took fosamax a long time ago.  Elevated BP -- improved to normal when rechecked. Has white coat without hypertension, not on any medications for BP High cholesterol -- takes rosuvastatin 5 mg daily.  Low vitamin D -- taking weekly prescription supplement.  Hypothyroidism --taking 75 mcg levothyroxine daily.  Lost 10 lbs.     Current Medication: Outpatient Encounter Medications as of 11/25/2022  Medication Sig   CALCIUM CITRATE-VITAMIN D PO Take by mouth daily.   ergocalciferol (VITAMIN D2) 1.25 MG (50000 UT) capsule Take 1 capsule (50,000 Units total) by mouth once a week.   hydrOXYzine (ATARAX) 10 MG tablet Take 1 tablet (10 mg total) by mouth 3 (three) times daily as needed.   triamcinolone (KENALOG) 0.025 % ointment Apply 1 Application topically 2 (two) times daily.   [DISCONTINUED] levothyroxine (SYNTHROID) 75 MCG tablet TAKE 1 TABLET BY MOUTH EVERY DAY BEFORE BREAKFAST   [DISCONTINUED] rosuvastatin (CRESTOR) 5 MG tablet Take 1 tablet (5 mg total) by mouth daily.   levothyroxine (SYNTHROID) 75 MCG tablet TAKE 1 TABLET BY MOUTH EVERY DAY BEFORE BREAKFAST   rosuvastatin (CRESTOR) 5 MG tablet Take 1 tablet (5 mg total) by mouth daily.   No facility-administered encounter medications on file as of 11/25/2022.    Surgical History: Past Surgical History:  Procedure Laterality Date   KNEE SURGERY Right    TUBAL LIGATION      Medical History: Past Medical History:  Diagnosis Date   Allergy    Depression    Thyroid disease     Family History: Family History  Problem Relation Age of Onset    Lung cancer Mother    Diabetes Maternal Grandmother    Heart attack Maternal Grandfather    Breast cancer Neg Hx     Social History   Socioeconomic History   Marital status: Divorced    Spouse name: Not on file   Number of children: Not on file   Years of education: Not on file   Highest education level: Not on file  Occupational History   Not on file  Tobacco Use   Smoking status: Never   Smokeless tobacco: Never  Substance and Sexual Activity   Alcohol use: Never   Drug use: Never   Sexual activity: Not on file  Other Topics Concern   Not on file  Social History Narrative   Not on file   Social Determinants of Health   Financial Resource Strain: Not on file  Food Insecurity: Not on file  Transportation Needs: Not on file  Physical Activity: Not on file  Stress: Not on file  Social Connections: Not on file  Intimate Partner Violence: Not on file      Review of Systems  Constitutional:  Negative for chills, fatigue and unexpected weight change.  HENT:  Negative for congestion, rhinorrhea, sneezing and sore throat.   Eyes:  Negative for redness.  Respiratory:  Negative for cough, chest tightness and shortness of breath.   Cardiovascular:  Negative for chest pain and palpitations.  Gastrointestinal:  Negative for abdominal pain, constipation, diarrhea, nausea and vomiting.  Genitourinary:  Negative for  dysuria and frequency.  Musculoskeletal:  Negative for arthralgias, back pain, joint swelling and neck pain.  Skin:  Negative for rash.  Neurological: Negative.  Negative for tremors and numbness.  Hematological:  Negative for adenopathy. Does not bruise/bleed easily.  Psychiatric/Behavioral:  Negative for behavioral problems (Depression), sleep disturbance and suicidal ideas. The patient is not nervous/anxious.     Vital Signs: BP 115/75 Comment: 150/90  Pulse 75   Temp 98.2 F (36.8 C)   Resp 16   Ht 4\' 11"  (1.499 m)   Wt 159 lb 9.6 oz (72.4 kg)   SpO2  98%   BMI 32.24 kg/m    Physical Exam Vitals reviewed.  Constitutional:      Appearance: Normal appearance. She is obese.  HENT:     Head: Normocephalic and atraumatic.  Eyes:     Pupils: Pupils are equal, round, and reactive to light.  Cardiovascular:     Rate and Rhythm: Normal rate and regular rhythm.  Pulmonary:     Effort: Pulmonary effort is normal. No respiratory distress.  Skin:    General: Skin is warm and dry.  Neurological:     Mental Status: She is alert.  Psychiatric:        Mood and Affect: Mood normal.        Behavior: Behavior normal.        Assessment/Plan: 1. Elevated blood pressure reading in office with white coat syndrome, without diagnosis of hypertension No intervention, will continue to monitor periodically.   2. Acquired hypothyroidism Continue levothyroxine as prescribed.  - levothyroxine (SYNTHROID) 75 MCG tablet; TAKE 1 TABLET BY MOUTH EVERY DAY BEFORE BREAKFAST  Dispense: 90 tablet; Refill: 1  3. Mixed hyperlipidemia Continue rosuvastatin as prescribed.  - rosuvastatin (CRESTOR) 5 MG tablet; Take 1 tablet (5 mg total) by mouth daily.  Dispense: 90 tablet; Refill: 1  4. Age-related osteoporosis without current pathological fracture Continue fosamax as prescribed.   5. Vitamin D deficiency Continue prescription weekly supplement   General Counseling: Rosey verbalizes understanding of the findings of todays visit and agrees with plan of treatment. I have discussed any further diagnostic evaluation that may be needed or ordered today. We also reviewed her medications today. she has been encouraged to call the office with any questions or concerns that should arise related to todays visit.    No orders of the defined types were placed in this encounter.   Meds ordered this encounter  Medications   rosuvastatin (CRESTOR) 5 MG tablet    Sig: Take 1 tablet (5 mg total) by mouth daily.    Dispense:  90 tablet    Refill:  1    levothyroxine (SYNTHROID) 75 MCG tablet    Sig: TAKE 1 TABLET BY MOUTH EVERY DAY BEFORE BREAKFAST    Dispense:  90 tablet    Refill:  1    Return for previously scheduled, AWV,  PCP in january 2025 .   Total time spent:30 Minutes Time spent includes review of chart, medications, test results, and follow up plan with the patient.   [place] Controlled Substance Database was reviewed by me.  This patient was seen by Sallyanne Kuster, FNP-C in collaboration with Dr. Beverely Risen as a part of collaborative care agreement.    R. Tedd Sias, MSN, FNP-C Internal medicine

## 2022-11-30 ENCOUNTER — Encounter: Payer: Self-pay | Admitting: Nurse Practitioner

## 2023-05-30 ENCOUNTER — Ambulatory Visit: Payer: No Typology Code available for payment source | Admitting: Nurse Practitioner

## 2023-05-30 ENCOUNTER — Encounter: Payer: Self-pay | Admitting: Nurse Practitioner

## 2023-05-30 VITALS — BP 128/88 | HR 84 | Temp 98.3°F | Resp 16 | Ht 59.0 in | Wt 159.2 lb

## 2023-05-30 DIAGNOSIS — E039 Hypothyroidism, unspecified: Secondary | ICD-10-CM | POA: Diagnosis not present

## 2023-05-30 DIAGNOSIS — Z0000 Encounter for general adult medical examination without abnormal findings: Secondary | ICD-10-CM | POA: Diagnosis not present

## 2023-05-30 DIAGNOSIS — E782 Mixed hyperlipidemia: Secondary | ICD-10-CM

## 2023-05-30 MED ORDER — LEVOTHYROXINE SODIUM 75 MCG PO TABS
ORAL_TABLET | ORAL | 1 refills | Status: DC
Start: 2023-05-30 — End: 2023-11-26

## 2023-05-30 MED ORDER — ROSUVASTATIN CALCIUM 5 MG PO TABS
5.0000 mg | ORAL_TABLET | Freq: Every day | ORAL | 1 refills | Status: DC
Start: 2023-05-30 — End: 2023-11-26

## 2023-05-30 NOTE — Progress Notes (Signed)
 [facility] [address] [place], [place] [address]  Internal MEDICINE  Office Visit Note  Patient Name: Erin Chan  [id number]  [id number]  Date of Service: 05/30/2023  Chief Complaint  Patient presents with   Depression   Annual Exam    HPI Erin Chan presents for an annual well visit and physical exam.  Well-appearing 68 y.o. female with hypothyroidism, asthma, osteopenia, GAD, and atopic dermatitis.  Routine CRC screening: cologuard due in 2027 Routine mammogram: due now  DEXA scan: done in 2020 Labs: due for routine labs  New or worsening pain: none  Other concerns: none       05/30/2023   10:50 AM  MMSE - Mini Mental State Exam  Orientation to time 5  Orientation to Place 5  Registration 3  Attention/ Calculation 5  Recall 2  Language- name 2 objects 2  Language- repeat 1  Language- follow 3 step command 3  Language- read & follow direction 1  Write a sentence 1  Copy design 1  Total score 29    Medicare Risk at Home - 05/30/23 1048     Any stairs in or around the home? Yes    If so, are there any without handrails? Yes    Home free of loose throw rugs in walkways, pet beds, electrical cords, etc? Yes    Adequate lighting in your home to reduce risk of falls? Yes    Life alert? No    Use of a cane, walker or w/c? No    Grab bars in the bathroom? No    Shower chair or bench in shower? No    Elevated toilet seat or a handicapped toilet? No              Functional Status Survey: Is the patient deaf or have difficulty hearing?: No Does the patient have difficulty seeing, even when wearing glasses/contacts?: No Does the patient have difficulty concentrating, remembering, or making decisions?: No Does the patient have difficulty walking or climbing stairs?: No Does the patient have difficulty dressing or bathing?: No Does the patient have difficulty doing errands alone such as visiting a doctor's office or shopping?: No      11/07/2021    9:41 AM 05/24/2022   10:01 AM 11/25/2022   10:13 AM 05/30/2023   10:04 AM 05/30/2023   10:47 AM  Fall Risk  Falls in the past year? 1 0 0 0   Was there an injury with Fall? 0 0  0   Fall Risk Category Calculator 1 0  0   Fall Risk Category (Retired) Low      Patient at Risk for Falls Due to  No Fall Risks  No Fall Risks No Fall Risks  Fall risk Follow up  Falls evaluation completed  Falls evaluation completed Falls evaluation completed       05/30/2023   10:49 AM  Depression screen PHQ 2/9  Decreased Interest 0  Down, Depressed, Hopeless 0  PHQ - 2 Score 0       Current Medication: Outpatient Encounter Medications as of 05/30/2023  Medication Sig   CALCIUM CITRATE-VITAMIN D PO Take by mouth daily.   ergocalciferol (VITAMIN D2) 1.25 MG (50000 UT) capsule Take 1 capsule (50,000 Units total) by mouth once a week.   hydrOXYzine (ATARAX) 10 MG tablet Take 1 tablet (10 mg total) by mouth 3 (three) times daily as needed.   triamcinolone (KENALOG) 0.025 % ointment Apply 1 Application topically 2 (two)  times daily.   [DISCONTINUED] levothyroxine (SYNTHROID) 75 MCG tablet TAKE 1 TABLET BY MOUTH EVERY DAY BEFORE BREAKFAST   [DISCONTINUED] rosuvastatin (CRESTOR) 5 MG tablet Take 1 tablet (5 mg total) by mouth daily.   levothyroxine (SYNTHROID) 75 MCG tablet TAKE 1 TABLET BY MOUTH EVERY DAY BEFORE BREAKFAST   rosuvastatin (CRESTOR) 5 MG tablet Take 1 tablet (5 mg total) by mouth daily.   No facility-administered encounter medications on file as of 05/30/2023.    Surgical History: Past Surgical History:  Procedure Laterality Date   KNEE SURGERY Right    TUBAL LIGATION      Medical History: Past Medical History:  Diagnosis Date   Allergy    Depression    Thyroid disease     Family History: Family History  Problem Relation Age of Onset   Lung cancer Mother    Diabetes Maternal Grandmother    Heart attack Maternal Grandfather    Breast cancer Neg Hx     Social  History   Socioeconomic History   Marital status: Divorced    Spouse name: Not on file   Number of children: Not on file   Years of education: Not on file   Highest education level: Not on file  Occupational History   Not on file  Tobacco Use   Smoking status: Never   Smokeless tobacco: Never  Substance and Sexual Activity   Alcohol use: Never   Drug use: Never   Sexual activity: Not on file  Other Topics Concern   Not on file  Social History Narrative   Not on file   Social Drivers of Health   Financial Resource Strain: Not on file  Food Insecurity: Not on file  Transportation Needs: Not on file  Physical Activity: Not on file  Stress: Not on file  Social Connections: Not on file  Intimate Partner Violence: Not on file      Review of Systems  Constitutional:  Negative for activity change, appetite change, chills, fatigue, fever and unexpected weight change.  HENT: Negative.  Negative for congestion, ear pain, rhinorrhea, sore throat and trouble swallowing.   Eyes: Negative.   Respiratory: Negative.  Negative for cough, chest tightness, shortness of breath and wheezing.   Cardiovascular: Negative.  Negative for chest pain.  Gastrointestinal: Negative.  Negative for abdominal pain, blood in stool, constipation, diarrhea, nausea and vomiting.  Endocrine: Negative.   Genitourinary: Negative.  Negative for difficulty urinating, dysuria, frequency, hematuria and urgency.  Musculoskeletal: Negative.  Negative for arthralgias, back pain, joint swelling, myalgias and neck pain.  Skin:  Negative for rash and wound.  Allergic/Immunologic: Negative.  Negative for immunocompromised state.  Neurological: Negative.  Negative for dizziness, seizures, numbness and headaches.  Hematological: Negative.   Psychiatric/Behavioral:  Negative for behavioral problems, self-injury and suicidal ideas. The patient is not nervous/anxious.     Vital Signs: BP 128/88   Pulse 84   Temp 98.3  F (36.8 C)   Resp 16   Ht 4\' 11"  (1.499 m)   Wt 159 lb 3.2 oz (72.2 kg)   SpO2 99%   BMI 32.15 kg/m    Physical Exam Vitals reviewed.  Constitutional:      General: She is awake. She is not in acute distress.    Appearance: Normal appearance. She is well-developed, well-groomed and normal weight. She is not ill-appearing or diaphoretic.  HENT:     Head: Normocephalic and atraumatic.     Right Ear: Tympanic membrane, ear canal and external  ear normal.     Left Ear: Tympanic membrane, ear canal and external ear normal.     Nose: No congestion or rhinorrhea.     Mouth/Throat:     Lips: Pink.     Mouth: Mucous membranes are moist.     Pharynx: Oropharynx is clear. Uvula midline. No oropharyngeal exudate or posterior oropharyngeal erythema.  Eyes:     General: Lids are normal. Vision grossly intact. Gaze aligned appropriately. No scleral icterus.       Right eye: No discharge.        Left eye: No discharge.     Extraocular Movements: Extraocular movements intact.     Conjunctiva/sclera: Conjunctivae normal.     Pupils: Pupils are equal, round, and reactive to light.     Funduscopic exam:    Right eye: Red reflex present.        Left eye: Red reflex present. Neck:     Thyroid: No thyromegaly.     Vascular: No JVD.     Trachea: Trachea and phonation normal. No tracheal deviation.  Cardiovascular:     Rate and Rhythm: Normal rate and regular rhythm.     Pulses: Normal pulses.     Heart sounds: Normal heart sounds, S1 normal and S2 normal. No murmur heard.    No friction rub. No gallop.  Pulmonary:     Effort: Pulmonary effort is normal. No accessory muscle usage or respiratory distress.     Breath sounds: Normal breath sounds and air entry. No stridor. No wheezing or rales.  Chest:     Chest wall: No tenderness.     Comments: Declined clinical breast exam.  Abdominal:     General: Bowel sounds are normal. There is no distension.     Palpations: Abdomen is soft. There is no  shifting dullness, fluid wave, mass or pulsatile mass.     Tenderness: There is no abdominal tenderness. There is no guarding or rebound.  Musculoskeletal:        General: No tenderness or deformity. Normal range of motion.     Cervical back: Normal range of motion and neck supple.     Right lower leg: No edema.     Left lower leg: No edema.  Lymphadenopathy:     Cervical: No cervical adenopathy.  Skin:    General: Skin is warm and dry.     Capillary Refill: Capillary refill takes less than 2 seconds.     Coloration: Skin is not pale.     Findings: No erythema or rash.     Comments: Scaling plaques on upper and lower extremities  Neurological:     Mental Status: She is alert and oriented to person, place, and time.     Cranial Nerves: No cranial nerve deficit.     Motor: No abnormal muscle tone.     Coordination: Coordination normal.     Gait: Gait normal.     Deep Tendon Reflexes: Reflexes are normal and symmetric.  Psychiatric:        Mood and Affect: Mood normal.        Behavior: Behavior normal. Behavior is cooperative.        Thought Content: Thought content normal.        Judgment: Judgment normal.        Assessment/Plan: 1. Encounter for subsequent annual wellness visit (AWV) in Medicare patient (Primary) Age-appropriate preventive screenings and vaccinations discussed. Routine labs for health maintenance will be ordered. PHM updated.  2. Acquired hypothyroidism Continue levothyroxine as prescribed, refills ordered  - levothyroxine (SYNTHROID) 75 MCG tablet; TAKE 1 TABLET BY MOUTH EVERY DAY BEFORE BREAKFAST  Dispense: 90 tablet; Refill: 1  3. Mixed hyperlipidemia Continue rosuvastatin as prescribed, refills ordered  - rosuvastatin (CRESTOR) 5 MG tablet; Take 1 tablet (5 mg total) by mouth daily.  Dispense: 90 tablet; Refill: 1     General Counseling: Shabana verbalizes understanding of the findings of todays visit and agrees with plan of treatment. I have  discussed any further diagnostic evaluation that may be needed or ordered today. We also reviewed her medications today. she has been encouraged to call the office with any questions or concerns that should arise related to todays visit.    No orders of the defined types were placed in this encounter.   Meds ordered this encounter  Medications   levothyroxine (SYNTHROID) 75 MCG tablet    Sig: TAKE 1 TABLET BY MOUTH EVERY DAY BEFORE BREAKFAST    Dispense:  90 tablet    Refill:  1   rosuvastatin (CRESTOR) 5 MG tablet    Sig: Take 1 tablet (5 mg total) by mouth daily.    Dispense:  90 tablet    Refill:  1    Return in about 6 months (around 11/27/2023) for F/U, Rc Amison PCP.   Total time spent:30 Minutes Time spent includes review of chart, medications, test results, and follow up plan with the patient.   [place] Controlled Substance Database was reviewed by me.  This patient was seen by Sallyanne Kuster, FNP-C in collaboration with Dr. Beverely Risen as a part of collaborative care agreement.  Jakayden Cancio R. Tedd Sias, MSN, FNP-C Internal medicine

## 2023-06-01 ENCOUNTER — Other Ambulatory Visit: Payer: Self-pay | Admitting: Nurse Practitioner

## 2023-06-01 DIAGNOSIS — Z0001 Encounter for general adult medical examination with abnormal findings: Secondary | ICD-10-CM

## 2023-06-02 ENCOUNTER — Other Ambulatory Visit: Payer: Self-pay | Admitting: Nurse Practitioner

## 2023-06-02 DIAGNOSIS — E039 Hypothyroidism, unspecified: Secondary | ICD-10-CM | POA: Diagnosis not present

## 2023-06-02 DIAGNOSIS — E782 Mixed hyperlipidemia: Secondary | ICD-10-CM | POA: Diagnosis not present

## 2023-06-02 DIAGNOSIS — E559 Vitamin D deficiency, unspecified: Secondary | ICD-10-CM | POA: Diagnosis not present

## 2023-06-02 DIAGNOSIS — M810 Age-related osteoporosis without current pathological fracture: Secondary | ICD-10-CM | POA: Diagnosis not present

## 2023-06-02 DIAGNOSIS — L209 Atopic dermatitis, unspecified: Secondary | ICD-10-CM | POA: Diagnosis not present

## 2023-06-03 LAB — LIPID PANEL
Chol/HDL Ratio: 3 {ratio} (ref 0.0–4.4)
Cholesterol, Total: 138 mg/dL (ref 100–199)
HDL: 46 mg/dL (ref >39)
LDL Chol Calc (NIH): 69 mg/dL (ref 0–99)
Triglycerides: 130 mg/dL (ref 0–149)
VLDL Cholesterol Cal: 23 mg/dL (ref 5–40)

## 2023-06-03 LAB — VITAMIN D 25 HYDROXY (VIT D DEFICIENCY, FRACTURES): Vit D, 25-Hydroxy: 21.9 ng/mL — ABNORMAL LOW (ref 30.0–100.0)

## 2023-06-03 LAB — CBC WITH DIFFERENTIAL/PLATELET
Basophils Absolute: 0 10*3/uL (ref 0.0–0.2)
Basos: 1 %
EOS (ABSOLUTE): 0.5 10*3/uL — ABNORMAL HIGH (ref 0.0–0.4)
Eos: 8 %
Hematocrit: 37.6 % (ref 34.0–46.6)
Hemoglobin: 12.3 g/dL (ref 11.1–15.9)
Immature Grans (Abs): 0 10*3/uL (ref 0.0–0.1)
Immature Granulocytes: 0 %
Lymphocytes Absolute: 1.9 10*3/uL (ref 0.7–3.1)
Lymphs: 30 %
MCH: 31.4 pg (ref 26.6–33.0)
MCHC: 32.7 g/dL (ref 31.5–35.7)
MCV: 96 fL (ref 79–97)
Monocytes Absolute: 0.4 10*3/uL (ref 0.1–0.9)
Monocytes: 6 %
Neutrophils Absolute: 3.5 10*3/uL (ref 1.4–7.0)
Neutrophils: 55 %
Platelets: 366 10*3/uL (ref 150–450)
RBC: 3.92 x10E6/uL (ref 3.77–5.28)
RDW: 12.9 % (ref 11.7–15.4)
WBC: 6.3 10*3/uL (ref 3.4–10.8)

## 2023-06-03 LAB — COMPREHENSIVE METABOLIC PANEL
ALT: 17 [IU]/L (ref 0–32)
AST: 19 [IU]/L (ref 0–40)
Albumin: 4.3 g/dL (ref 3.9–4.9)
Alkaline Phosphatase: 93 [IU]/L (ref 44–121)
BUN/Creatinine Ratio: 17 (ref 12–28)
BUN: 12 mg/dL (ref 8–27)
Bilirubin Total: 0.3 mg/dL (ref 0.0–1.2)
CO2: 23 mmol/L (ref 20–29)
Calcium: 9.5 mg/dL (ref 8.7–10.3)
Chloride: 103 mmol/L (ref 96–106)
Creatinine, Ser: 0.71 mg/dL (ref 0.57–1.00)
Globulin, Total: 2.9 g/dL (ref 1.5–4.5)
Glucose: 94 mg/dL (ref 70–99)
Potassium: 4.4 mmol/L (ref 3.5–5.2)
Sodium: 140 mmol/L (ref 134–144)
Total Protein: 7.2 g/dL (ref 6.0–8.5)
eGFR: 94 mL/min/{1.73_m2} (ref >59)

## 2023-06-03 LAB — T4, FREE: Free T4: 1.1 ng/dL (ref 0.82–1.77)

## 2023-06-03 LAB — TSH: TSH: 1.98 u[IU]/mL (ref 0.450–4.500)

## 2023-06-13 ENCOUNTER — Other Ambulatory Visit: Payer: Self-pay | Admitting: Nurse Practitioner

## 2023-06-13 DIAGNOSIS — Z1231 Encounter for screening mammogram for malignant neoplasm of breast: Secondary | ICD-10-CM

## 2023-06-23 ENCOUNTER — Ambulatory Visit
Admission: RE | Admit: 2023-06-23 | Discharge: 2023-06-23 | Disposition: A | Discharge status: Home / Self Care | Payer: No Typology Code available for payment source | Source: Ambulatory Visit | Attending: Nurse Practitioner | Admitting: Nurse Practitioner

## 2023-06-23 DIAGNOSIS — Z1231 Encounter for screening mammogram for malignant neoplasm of breast: Secondary | ICD-10-CM | POA: Diagnosis not present

## 2023-07-05 ENCOUNTER — Encounter: Payer: Self-pay | Admitting: Nurse Practitioner

## 2023-11-26 ENCOUNTER — Ambulatory Visit (INDEPENDENT_AMBULATORY_CARE_PROVIDER_SITE_OTHER): Payer: No Typology Code available for payment source | Admitting: Nurse Practitioner

## 2023-11-26 ENCOUNTER — Encounter: Payer: Self-pay | Admitting: Nurse Practitioner

## 2023-11-26 VITALS — BP 116/80 | HR 95 | Temp 98.4°F | Resp 16 | Ht 59.0 in | Wt 157.0 lb

## 2023-11-26 DIAGNOSIS — E559 Vitamin D deficiency, unspecified: Secondary | ICD-10-CM

## 2023-11-26 DIAGNOSIS — L209 Atopic dermatitis, unspecified: Secondary | ICD-10-CM

## 2023-11-26 DIAGNOSIS — E039 Hypothyroidism, unspecified: Secondary | ICD-10-CM | POA: Diagnosis not present

## 2023-11-26 DIAGNOSIS — E782 Mixed hyperlipidemia: Secondary | ICD-10-CM | POA: Diagnosis not present

## 2023-11-26 MED ORDER — ROSUVASTATIN CALCIUM 5 MG PO TABS: 5.0000 mg | ORAL_TABLET | Freq: Every day | ORAL | 1 refills | Status: AC

## 2023-11-26 MED ORDER — CLOBETASOL PROPIONATE 0.05 % EX CREA: 1.0000 | TOPICAL_CREAM | Freq: Two times a day (BID) | CUTANEOUS | 5 refills | Status: AC

## 2023-11-26 MED ORDER — ERGOCALCIFEROL 1.25 MG (50000 UT) PO CAPS: 50000.0000 [IU] | ORAL_CAPSULE | ORAL | 11 refills | Status: AC

## 2023-11-26 MED ORDER — LEVOTHYROXINE SODIUM 75 MCG PO TABS: ORAL_TABLET | ORAL | 1 refills | Status: AC

## 2023-11-26 NOTE — Progress Notes (Signed)
 [facility] [address] [place], [place] [address]  Internal MEDICINE  Office Visit Note  Patient Name: Erin Chan  [id number]  [id number]  Date of Service: 11/26/2023  Chief Complaint  Patient presents with   Depression   Follow-up    HPI Erin Chan presents for a follow-up visit for hypothyroidism, high cholesterol and low vitamin D   Hypothyroidism -- takes levothyroxine  daily High cholesterol -- takes rosuvastatin  daily.  Blood pressure is normal today. Low vitamin D  -- taking weekly supplement  Still having scaling rash on ankles and it is very itchy.    Current Medication: Outpatient Encounter Medications as of 11/26/2023  Medication Sig   clobetasol  cream (TEMOVATE ) 0.05 % Apply 1 Application topically 2 (two) times daily. To rash on ankles as needed until resolved. And then as needed for flare ups.   CALCIUM  CITRATE-VITAMIN D  PO Take by mouth daily.   ergocalciferol  (VITAMIN D2) 1.25 MG (50000 UT) capsule Take 1 capsule (50,000 Units total) by mouth once a week.   levothyroxine  (SYNTHROID ) 75 MCG tablet TAKE 1 TABLET BY MOUTH EVERY DAY BEFORE BREAKFAST   rosuvastatin  (CRESTOR ) 5 MG tablet Take 1 tablet (5 mg total) by mouth daily.   [DISCONTINUED] ergocalciferol  (VITAMIN D2) 1.25 MG (50000 UT) capsule Take 1 capsule (50,000 Units total) by mouth once a week.   [DISCONTINUED] hydrOXYzine  (ATARAX ) 10 MG tablet Take 1 tablet (10 mg total) by mouth 3 (three) times daily as needed.   [DISCONTINUED] levothyroxine  (SYNTHROID ) 75 MCG tablet TAKE 1 TABLET BY MOUTH EVERY DAY BEFORE BREAKFAST   [DISCONTINUED] rosuvastatin  (CRESTOR ) 5 MG tablet Take 1 tablet (5 mg total) by mouth daily.   [DISCONTINUED] triamcinolone  (KENALOG ) 0.025 % ointment APPLY TO AFFECTED AREA TWICE A DAY   No facility-administered encounter medications on file as of 11/26/2023.    Surgical History: Past Surgical History:  Procedure Laterality Date   KNEE SURGERY Right    TUBAL LIGATION       Medical History: Past Medical History:  Diagnosis Date   Allergy    Depression    Thyroid  disease     Family History: Family History  Problem Relation Age of Onset   Lung cancer Mother    Diabetes Maternal Grandmother    Heart attack Maternal Grandfather    Breast cancer Neg Hx     Social History   Socioeconomic History   Marital status: Divorced    Spouse name: Not on file   Number of children: Not on file   Years of education: Not on file   Highest education level: Not on file  Occupational History   Not on file  Tobacco Use   Smoking status: Never   Smokeless tobacco: Never  Substance and Sexual Activity   Alcohol use: Never   Drug use: Never   Sexual activity: Not on file  Other Topics Concern   Not on file  Social History Narrative   Not on file   Social Drivers of Health   Financial Resource Strain: Not on file  Food Insecurity: Not on file  Transportation Needs: Not on file  Physical Activity: Not on file  Stress: Not on file  Social Connections: Not on file  Intimate Partner Violence: Not on file      Review of Systems  Constitutional:  Negative for chills, fatigue and unexpected weight change.  HENT:  Negative for congestion, rhinorrhea, sneezing and sore throat.   Eyes:  Negative for redness.  Respiratory:  Negative for cough, chest  tightness and shortness of breath.   Cardiovascular:  Negative for chest pain and palpitations.  Gastrointestinal:  Negative for abdominal pain, constipation, diarrhea, nausea and vomiting.  Genitourinary:  Negative for dysuria and frequency.  Musculoskeletal:  Negative for arthralgias, back pain, joint swelling and neck pain.  Skin:  Negative for rash.  Neurological: Negative.  Negative for tremors and numbness.  Hematological:  Negative for adenopathy. Does not bruise/bleed easily.  Psychiatric/Behavioral:  Negative for behavioral problems (Depression), sleep disturbance and suicidal ideas. The patient is  not nervous/anxious.     Vital Signs: BP 116/80   Pulse 95   Temp 98.4 F (36.9 C)   Resp 16   Ht 4' 11 (1.499 m)   Wt 157 lb (71.2 kg)   SpO2 97%   BMI 31.71 kg/m    Physical Exam Vitals reviewed.  Constitutional:      Appearance: Normal appearance. She is obese.  HENT:     Head: Normocephalic and atraumatic.  Eyes:     Pupils: Pupils are equal, round, and reactive to light.  Cardiovascular:     Rate and Rhythm: Normal rate and regular rhythm.  Pulmonary:     Effort: Pulmonary effort is normal. No respiratory distress.  Skin:    General: Skin is warm and dry.  Neurological:     Mental Status: She is alert.  Psychiatric:        Mood and Affect: Mood normal.        Behavior: Behavior normal.        Assessment/Plan: 1. Acquired hypothyroidism (Primary) Continue levothyroxine  as prescribed.  - levothyroxine  (SYNTHROID ) 75 MCG tablet; TAKE 1 TABLET BY MOUTH EVERY DAY BEFORE BREAKFAST  Dispense: 90 tablet; Refill: 1  2. Mixed hyperlipidemia Continue rosuvastatin  as prescribed - rosuvastatin  (CRESTOR ) 5 MG tablet; Take 1 tablet (5 mg total) by mouth daily.  Dispense: 90 tablet; Refill: 1  3. Vitamin D  deficiency Continue weekly vitamin D  supplement.  - ergocalciferol  (VITAMIN D2) 1.25 MG (50000 UT) capsule; Take 1 capsule (50,000 Units total) by mouth once a week.  Dispense: 4 capsule; Refill: 11  4. Atopic dermatitis, unspecified type Start topical clobetasol  cream to rash on ankles and elbows.  - clobetasol  cream (TEMOVATE ) 0.05 %; Apply 1 Application topically 2 (two) times daily. To rash on ankles as needed until resolved. And then as needed for flare ups.  Dispense: 45 g; Refill: 5   General Counseling: [name] verbalizes understanding of the findings of todays visit and agrees with plan of treatment. I have discussed any further diagnostic evaluation that may be needed or ordered today. We also reviewed her medications today. she has been encouraged to call  the office with any questions or concerns that should arise related to todays visit.    No orders of the defined types were placed in this encounter.   Meds ordered this encounter  Medications   clobetasol  cream (TEMOVATE ) 0.05 %    Sig: Apply 1 Application topically 2 (two) times daily. To rash on ankles as needed until resolved. And then as needed for flare ups.    Dispense:  45 g    Refill:  5   levothyroxine  (SYNTHROID ) 75 MCG tablet    Sig: TAKE 1 TABLET BY MOUTH EVERY DAY BEFORE BREAKFAST    Dispense:  90 tablet    Refill:  1   rosuvastatin  (CRESTOR ) 5 MG tablet    Sig: Take 1 tablet (5 mg total) by mouth daily.    Dispense:  90 tablet    Refill:  1   ergocalciferol  (VITAMIN D2) 1.25 MG (50000 UT) capsule    Sig: Take 1 capsule (50,000 Units total) by mouth once a week.    Dispense:  4 capsule    Refill:  11    Return for previously scheduled, AWV, Kavish Lafitte PCP in early february. .   Total time spent:30 Minutes Time spent includes review of chart, medications, test results, and follow up plan with the patient.   [place] Controlled Substance Database was reviewed by me.  This patient was seen by Mardy Maxin, FNP-C in collaboration with Dr. Sigrid Bathe as a part of collaborative care agreement.   Kyley Solow R. Maxin, MSN, FNP-C Internal medicine

## 2023-11-27 ENCOUNTER — Encounter: Payer: Self-pay | Admitting: Nurse Practitioner

## 2024-01-15 ENCOUNTER — Telehealth: Payer: Self-pay

## 2024-01-15 NOTE — Telephone Encounter (Signed)
 Pt called that she sinus issue and cough and need appt advised her to go to Urgent care

## 2024-01-16 DIAGNOSIS — J40 Bronchitis, not specified as acute or chronic: Secondary | ICD-10-CM | POA: Diagnosis not present

## 2024-01-16 DIAGNOSIS — R5383 Other fatigue: Secondary | ICD-10-CM | POA: Diagnosis not present

## 2024-01-16 DIAGNOSIS — R42 Dizziness and giddiness: Secondary | ICD-10-CM | POA: Diagnosis not present

## 2024-01-16 DIAGNOSIS — Z03818 Encounter for observation for suspected exposure to other biological agents ruled out: Secondary | ICD-10-CM | POA: Diagnosis not present

## 2024-01-16 DIAGNOSIS — R051 Acute cough: Secondary | ICD-10-CM | POA: Diagnosis not present

## 2024-01-26 ENCOUNTER — Ambulatory Visit (INDEPENDENT_AMBULATORY_CARE_PROVIDER_SITE_OTHER): Admitting: Nurse Practitioner

## 2024-01-26 ENCOUNTER — Encounter: Payer: Self-pay | Admitting: Nurse Practitioner

## 2024-01-26 VITALS — BP 126/80 | HR 92 | Temp 98.2°F | Resp 16 | Ht 59.0 in | Wt 157.4 lb

## 2024-01-26 DIAGNOSIS — E782 Mixed hyperlipidemia: Secondary | ICD-10-CM

## 2024-01-26 DIAGNOSIS — E039 Hypothyroidism, unspecified: Secondary | ICD-10-CM

## 2024-01-26 DIAGNOSIS — R9431 Abnormal electrocardiogram [ECG] [EKG]: Secondary | ICD-10-CM

## 2024-01-26 DIAGNOSIS — J209 Acute bronchitis, unspecified: Secondary | ICD-10-CM

## 2024-01-26 DIAGNOSIS — E559 Vitamin D deficiency, unspecified: Secondary | ICD-10-CM | POA: Diagnosis not present

## 2024-01-26 NOTE — Progress Notes (Signed)
 [facility] [address] [place], [place] [address]  Internal MEDICINE  Office Visit Note  Patient Name: Erin Chan  [id number]  [id number]  Date of Service: 01/26/2024  Chief Complaint  Patient presents with   Depression   Follow-up    HPI Erin Chan presents for a follow-up visit for recent urgent care visit and EKG results. Recent visit to urgent care -- EKG was done and was normal except for a couple of leads with inverted T waves which are most likely a normal variant. Diagnosed with bronchitis, finished her medications and is feeling better.  High cholesterol -- taking rosuvastatin  daily. Her last lipid panel was normal.  Low vitamin D  -- taking weekly vitamin D  supplement.    Current Medication: Outpatient Encounter Medications as of 01/26/2024  Medication Sig   CALCIUM  CITRATE-VITAMIN D  PO Take by mouth daily.   clobetasol  cream (TEMOVATE ) 0.05 % Apply 1 Application topically 2 (two) times daily. To rash on ankles as needed until resolved. And then as needed for flare ups.   ergocalciferol  (VITAMIN D2) 1.25 MG (50000 UT) capsule Take 1 capsule (50,000 Units total) by mouth once a week.   levothyroxine  (SYNTHROID ) 75 MCG tablet TAKE 1 TABLET BY MOUTH EVERY DAY BEFORE BREAKFAST   rosuvastatin  (CRESTOR ) 5 MG tablet Take 1 tablet (5 mg total) by mouth daily.   No facility-administered encounter medications on file as of 01/26/2024.    Surgical History: Past Surgical History:  Procedure Laterality Date   KNEE SURGERY Right    TUBAL LIGATION      Medical History: Past Medical History:  Diagnosis Date   Allergy    Depression    Thyroid  disease     Family History: Family History  Problem Relation Age of Onset   Lung cancer Mother    Diabetes Maternal Grandmother    Heart attack Maternal Grandfather    Breast cancer Neg Hx     Social History   Socioeconomic History   Marital status: Divorced    Spouse name: Not on file   Number of  children: Not on file   Years of education: Not on file   Highest education level: Not on file  Occupational History   Not on file  Tobacco Use   Smoking status: Never   Smokeless tobacco: Never  Substance and Sexual Activity   Alcohol use: Never   Drug use: Never   Sexual activity: Not on file  Other Topics Concern   Not on file  Social History Narrative   Not on file   Social Drivers of Health   Financial Resource Strain: Low Risk  (01/16/2024)   Received from [facility]   Overall Financial Resource Strain (CARDIA)    Difficulty of Paying Living Expenses: Not hard at all  Food Insecurity: No Food Insecurity (01/16/2024)   Received from [facility]   Hunger Vital Sign    Within the past 12 months, you worried that your food would run out before you got the money to buy more.: Never true    Within the past 12 months, the food you bought just didn't last and you didn't have money to get more.: Never true  Transportation Needs: No Transportation Needs (01/16/2024)   Received from [facility] - Transportation    In the past 12 months, has lack of transportation kept you from medical appointments or from getting medications?: No    Lack of Transportation (Non-Medical):  No  Physical Activity: Not on file  Stress: Not on file  Social Connections: Not on file  Intimate Partner Violence: Not on file      Review of Systems  Constitutional:  Negative for chills, fatigue and unexpected weight change.  HENT:  Negative for congestion, rhinorrhea, sneezing and sore throat.   Eyes:  Negative for redness.  Respiratory:  Negative for cough, chest tightness and shortness of breath.   Cardiovascular:  Negative for chest pain and palpitations.  Gastrointestinal:  Negative for abdominal pain, constipation, diarrhea, nausea and vomiting.  Genitourinary:  Negative for dysuria and frequency.  Musculoskeletal:  Negative for  arthralgias, back pain, joint swelling and neck pain.  Skin:  Negative for rash.  Neurological: Negative.  Negative for tremors and numbness.  Hematological:  Negative for adenopathy. Does not bruise/bleed easily.  Psychiatric/Behavioral:  Negative for behavioral problems (Depression), sleep disturbance and suicidal ideas. The patient is not nervous/anxious.     Vital Signs: BP 126/80   Pulse 92   Temp 98.2 F (36.8 C)   Resp 16   Ht 4' 11 (1.499 m)   Wt 157 lb 6.4 oz (71.4 kg)   SpO2 95%   BMI 31.79 kg/m    Physical Exam Vitals reviewed.  Constitutional:      Appearance: Normal appearance. She is obese.  HENT:     Head: Normocephalic and atraumatic.  Eyes:     Pupils: Pupils are equal, round, and reactive to light.  Cardiovascular:     Rate and Rhythm: Normal rate and regular rhythm.  Pulmonary:     Effort: Pulmonary effort is normal. No respiratory distress.  Skin:    General: Skin is warm and dry.  Neurological:     Mental Status: She is alert.  Psychiatric:        Mood and Affect: Mood normal.        Behavior: Behavior normal.        Assessment/Plan: 1. T wave inversion in EKG (Primary) Noted by urgent care provider. EKG reviewed, no abnormality noted that requires intervention at this time, will continue to monitor  2. Acute bronchitis, unspecified organism Symptoms are improving, continue treatment as prescribed by urgent care provider.   3. Acquired hypothyroidism Routine labs ordered  - CBC with Differential/Platelet - TSH + free T4 - Lipid Profile - CMP14+EGFR - Vitamin D  (25 hydroxy)  4. Mixed hyperlipidemia Routine labs ordered - CBC with Differential/Platelet - TSH + free T4 - Lipid Profile - CMP14+EGFR - Vitamin D  (25 hydroxy)  5. Vitamin D  deficiency Routine labs ordered  - CBC with Differential/Platelet - TSH + free T4 - Lipid Profile - CMP14+EGFR - Vitamin D  (25 hydroxy)   General Counseling: [name] verbalizes  understanding of the findings of todays visit and agrees with plan of treatment. I have discussed any further diagnostic evaluation that may be needed or ordered today. We also reviewed her medications today. she has been encouraged to call the office with any questions or concerns that should arise related to todays visit.    Orders Placed This Encounter  Procedures   CBC with Differential/Platelet   TSH + free T4   Lipid Profile   CMP14+EGFR   Vitamin D  (25 hydroxy)    No orders of the defined types were placed in this encounter.   Return for previously scheduled, AWV, Georgeann Brinkman PCP in february.   Total time spent:30 Minutes Time spent includes review of chart, medications, test results, and follow up plan with  the patient.   [place] Controlled Substance Database was reviewed by me.  This patient was seen by Mardy Maxin, FNP-C in collaboration with Dr. Sigrid Bathe as a part of collaborative care agreement.   Shamiya Demeritt R. Maxin, MSN, FNP-C Internal medicine

## 2024-02-11 ENCOUNTER — Encounter: Payer: Self-pay | Admitting: Nurse Practitioner

## 2024-05-11 ENCOUNTER — Ambulatory Visit: Payer: Self-pay | Admitting: Nurse Practitioner

## 2024-05-11 LAB — CMP14+EGFR
ALT: 16 IU/L (ref 0–32)
AST: 20 IU/L (ref 0–40)
Albumin: 4.6 g/dL (ref 3.9–4.9)
Alkaline Phosphatase: 84 IU/L (ref 49–135)
BUN/Creatinine Ratio: 19 (ref 12–28)
BUN: 15 mg/dL (ref 8–27)
Bilirubin Total: 0.2 mg/dL (ref 0.0–1.2)
CO2: 23 mmol/L (ref 20–29)
Calcium: 10.2 mg/dL (ref 8.7–10.3)
Chloride: 102 mmol/L (ref 96–106)
Creatinine, Ser: 0.79 mg/dL (ref 0.57–1.00)
Globulin, Total: 3.3 g/dL (ref 1.5–4.5)
Glucose: 98 mg/dL (ref 70–99)
Potassium: 4.7 mmol/L (ref 3.5–5.2)
Sodium: 142 mmol/L (ref 134–144)
Total Protein: 7.9 g/dL (ref 6.0–8.5)
eGFR: 82 mL/min/1.73

## 2024-05-11 LAB — TSH+FREE T4
Free T4: 1.08 ng/dL (ref 0.82–1.77)
TSH: 3.12 u[IU]/mL (ref 0.450–4.500)

## 2024-05-11 LAB — LIPID PANEL
Chol/HDL Ratio: 2.9 ratio (ref 0.0–4.4)
Cholesterol, Total: 170 mg/dL (ref 100–199)
HDL: 59 mg/dL
LDL Chol Calc (NIH): 91 mg/dL (ref 0–99)
Triglycerides: 111 mg/dL (ref 0–149)
VLDL Cholesterol Cal: 20 mg/dL (ref 5–40)

## 2024-05-11 LAB — CBC WITH DIFFERENTIAL/PLATELET
Basophils Absolute: 0 x10E3/uL (ref 0.0–0.2)
Basos: 1 %
EOS (ABSOLUTE): 0.5 x10E3/uL — ABNORMAL HIGH (ref 0.0–0.4)
Eos: 7 %
Hematocrit: 42.2 % (ref 34.0–46.6)
Hemoglobin: 13.7 g/dL (ref 11.1–15.9)
Immature Grans (Abs): 0 x10E3/uL (ref 0.0–0.1)
Immature Granulocytes: 0 %
Lymphocytes Absolute: 1.9 x10E3/uL (ref 0.7–3.1)
Lymphs: 29 %
MCH: 31.6 pg (ref 26.6–33.0)
MCHC: 32.5 g/dL (ref 31.5–35.7)
MCV: 97 fL (ref 79–97)
Monocytes Absolute: 0.4 x10E3/uL (ref 0.1–0.9)
Monocytes: 6 %
Neutrophils Absolute: 3.7 x10E3/uL (ref 1.4–7.0)
Neutrophils: 57 %
Platelets: 408 x10E3/uL (ref 150–450)
RBC: 4.34 x10E6/uL (ref 3.77–5.28)
RDW: 13.1 % (ref 11.7–15.4)
WBC: 6.5 x10E3/uL (ref 3.4–10.8)

## 2024-05-11 LAB — VITAMIN D 25 HYDROXY (VIT D DEFICIENCY, FRACTURES): Vit D, 25-Hydroxy: 50.8 ng/mL (ref 30.0–100.0)

## 2024-05-11 NOTE — Progress Notes (Signed)
 We will discuss the results of her labs at her upcoming appointment

## 2024-06-01 ENCOUNTER — Ambulatory Visit: Payer: No Typology Code available for payment source | Admitting: Nurse Practitioner

## 2024-06-10 ENCOUNTER — Ambulatory Visit: Admitting: Nurse Practitioner
